# Patient Record
Sex: Male | Born: 1990
Health system: Southern US, Community
[De-identification: ages and names within clinical notes are randomized; demographics above are authoritative.]

## PROBLEM LIST (undated history)

## (undated) DIAGNOSIS — U071 COVID-19: Secondary | ICD-10-CM

## (undated) HISTORY — PX: NO PAST SURGERIES: SHX2092

---

## 2014-10-21 ENCOUNTER — Other Ambulatory Visit: Payer: Self-pay | Admitting: Infectious Disease

## 2014-10-21 ENCOUNTER — Ambulatory Visit
Admission: RE | Admit: 2014-10-21 | Discharge: 2014-10-21 | Disposition: A | Discharge status: Home / Self Care | Payer: No Typology Code available for payment source | Source: Ambulatory Visit | Attending: Infectious Disease | Admitting: Infectious Disease

## 2014-10-21 DIAGNOSIS — Z111 Encounter for screening for respiratory tuberculosis: Secondary | ICD-10-CM

## 2016-08-31 ENCOUNTER — Emergency Department (HOSPITAL_COMMUNITY)
Admission: EM | Admit: 2016-08-31 | Discharge: 2016-09-01 | Disposition: A | Discharge status: Home / Self Care | Payer: No Typology Code available for payment source | Attending: Emergency Medicine | Admitting: Emergency Medicine

## 2016-08-31 ENCOUNTER — Encounter (HOSPITAL_COMMUNITY): Payer: Self-pay | Admitting: Emergency Medicine

## 2016-08-31 DIAGNOSIS — J310 Chronic rhinitis: Secondary | ICD-10-CM

## 2016-08-31 DIAGNOSIS — J00 Acute nasopharyngitis [common cold]: Secondary | ICD-10-CM | POA: Diagnosis not present

## 2016-08-31 DIAGNOSIS — R509 Fever, unspecified: Secondary | ICD-10-CM | POA: Diagnosis present

## 2016-08-31 DIAGNOSIS — J029 Acute pharyngitis, unspecified: Secondary | ICD-10-CM

## 2016-08-31 LAB — RAPID STREP SCREEN (MED CTR MEBANE ONLY): Streptococcus, Group A Screen (Direct): NEGATIVE

## 2016-08-31 MED ORDER — ACETAMINOPHEN 325 MG PO TABS
650.0000 mg | ORAL_TABLET | Freq: Once | ORAL | Status: AC | PRN
  Administered 2016-08-31: 650 mg via ORAL

## 2016-08-31 MED ORDER — ACETAMINOPHEN 325 MG PO TABS
ORAL_TABLET | ORAL | Status: AC
  Filled 2016-08-31: qty 2

## 2016-08-31 NOTE — ED Triage Notes (Signed)
Pt c/o sore throat and fever.  denies CP or SOB. States it hurts to swallow sometimes. Pt took tylenol yesterday. Pt took advil today. Pt in NAD. Pt also c/o headache.

## 2016-09-01 MED ORDER — ACETAMINOPHEN 500 MG PO TABS: 1000.0000 mg | ORAL_TABLET | Freq: Four times a day (QID) | ORAL | 0 refills | Status: DC | PRN

## 2016-09-01 MED ORDER — FLUTICASONE PROPIONATE 50 MCG/ACT NA SUSP: 2.0000 | Freq: Every day | NASAL | 0 refills | Status: DC

## 2016-09-01 MED ORDER — DEXAMETHASONE 4 MG PO TABS
10.0000 mg | ORAL_TABLET | Freq: Once | ORAL | Status: AC
  Administered 2016-09-01: 10 mg via ORAL
  Filled 2016-09-01: qty 3

## 2016-09-01 MED ORDER — KETOROLAC TROMETHAMINE 60 MG/2ML IM SOLN
60.0000 mg | Freq: Once | INTRAMUSCULAR | Status: AC
  Administered 2016-09-01: 60 mg via INTRAMUSCULAR
  Filled 2016-09-01: qty 2

## 2016-09-01 MED ORDER — IBUPROFEN 800 MG PO TABS: 800.0000 mg | ORAL_TABLET | Freq: Three times a day (TID) | ORAL | 0 refills | Status: DC

## 2016-09-01 MED ORDER — PENICILLIN G BENZATHINE 1200000 UNIT/2ML IM SUSP
1.2000 10*6.[IU] | Freq: Once | INTRAMUSCULAR | Status: AC
  Administered 2016-09-01: 1.2 10*6.[IU] via INTRAMUSCULAR
  Filled 2016-09-01: qty 2

## 2016-09-01 MED ORDER — CETIRIZINE HCL 10 MG PO TABS: 10.0000 mg | ORAL_TABLET | Freq: Every day | ORAL | 1 refills | Status: DC

## 2016-09-01 NOTE — ED Provider Notes (Signed)
[facility]PT Provider Note   CSN: [id number] Arrival date & time: 08/31/16  2138     History   Chief Complaint Chief Complaint  Patient presents with  . Fever  . Headache    HPI Jeffrey Jensen is a 25 y.o. male.  HPI  Patient is a 25 year old male who presents to the emergency department complaining of 2 days of fever, sore throat and associated intermittent headache located in the back of his head.  He does have dysphasia and mild neck pain he denies neck stiffness.  He has had a month of nasal congestion what she believes is seasonal, associated with some clear nasal drainage, intermittent itchiness and sneezing.  Today he had mild "scratchiness" in his throat associated with mild cough, but he denies chest congestion, productive sputum, shortness of breath, wheeze, chest pain, tightness, night sweats, fatigue.  He also reports generalized body aches.  He denies sick contacts, rash, abdominal pain, N, V, diarrhea.  His HA is occipital, intermittent, associated with fever.  He denies blurry vision, eye pain, near syncope  History reviewed. No pertinent past medical history.  There are no active problems to display for this patient.   History reviewed. No pertinent surgical history.     Home Medications    Prior to Admission medications   Medication Sig Start Date End Date Taking? Authorizing Provider  sodium-potassium bicarbonate (ALKA-SELTZER GOLD) TBEF dissolvable tablet Take 1 tablet by mouth daily as needed.   Yes Historical Provider, MD  acetaminophen (TYLENOL) 500 MG tablet Take 2 tablets (1,000 mg total) by mouth every 6 (six) hours as needed for moderate pain or fever. Do not exceed 4000 mg total in 24 hours 09/01/16   Danelle BerryLeisa Lisle Skillman, PA-C  cetirizine (ZYRTEC ALLERGY) 10 MG tablet Take 1 tablet (10 mg total) by mouth daily. 09/01/16   Danelle BerryLeisa Caroline Matters, PA-C  fluticasone (FLONASE) 50 MCG/ACT nasal spray Place 2 sprays into both nostrils daily. 09/01/16   Danelle BerryLeisa Maeli Spacek, PA-C    ibuprofen (ADVIL,MOTRIN) 800 MG tablet Take 1 tablet (800 mg total) by mouth 3 (three) times daily. Patient taking differently: Take 800 mg by mouth 2 (two) times daily.  09/01/16   Danelle BerryLeisa Haden Cavenaugh, PA-C    Family History No family history on file.  Social History Social History  Substance Use Topics  . Smoking status: Never Smoker  . Smokeless tobacco: Never Used  . Alcohol use No     Allergies   Patient has no known allergies.   Review of Systems Review of Systems 10 Systems reviewed and are negative for acute change except as noted in the HPI.   Physical Exam Updated Vital Signs BP 105/61   Pulse 80   Temp 99.4 F (37.4 C) (Oral)   Resp 18   Ht 5\' 3"  (1.6 m)   Wt 62.6 kg   SpO2 97%   BMI 24.45 kg/m   Physical Exam  Constitutional: He is oriented to person, place, and time. He appears well-developed and well-nourished. No distress.  HENT:  Head: Normocephalic and atraumatic.  Right Ear: Hearing, tympanic membrane, external ear and ear canal normal.  Left Ear: Hearing, tympanic membrane, external ear and ear canal normal.  Nose: Mucosal edema and rhinorrhea present. Right sinus exhibits no maxillary sinus tenderness and no frontal sinus tenderness. Left sinus exhibits no maxillary sinus tenderness and no frontal sinus tenderness.  Mouth/Throat: Uvula is midline. Mucous membranes are not pale, dry and not cyanotic. No trismus in the jaw. No uvula swelling. Oropharyngeal  exudate, posterior oropharyngeal edema and posterior oropharyngeal erythema present. No tonsillar abscesses. Tonsils are 3+ on the right. Tonsils are 3+ on the left.  Eyes: Conjunctivae, EOM and lids are normal. Pupils are equal, round, and reactive to light. Right eye exhibits no discharge. Left eye exhibits no discharge. No scleral icterus.  Neck: Normal range of motion, full passive range of motion without pain and phonation normal. Neck supple. No spinous process tenderness and no muscular tenderness  present. No neck rigidity. No tracheal deviation and normal range of motion present. No Brudzinski's sign and no Kernig's sign noted.  Cardiovascular: Normal rate, regular rhythm, normal heart sounds and intact distal pulses.  Exam reveals no gallop and no friction rub.   No murmur heard. Pulmonary/Chest: Effort normal and breath sounds normal. No stridor. No respiratory distress. He has no wheezes. He has no rales. He exhibits no tenderness.  Abdominal: Soft. Bowel sounds are normal. He exhibits no distension and no mass. There is no tenderness. There is no rebound and no guarding.  Musculoskeletal: Normal range of motion. He exhibits no deformity.  Lymphadenopathy:    He has cervical adenopathy.  Neurological: He is alert and oriented to person, place, and time. He has normal strength and normal reflexes. He is not disoriented. He displays no tremor. No cranial nerve deficit or sensory deficit. He exhibits normal muscle tone. He displays a negative Romberg sign. Coordination and gait normal.  Skin: Skin is warm and dry. Capillary refill takes less than 2 seconds. No rash noted. He is not diaphoretic. No erythema. No pallor.  Psychiatric: He has a normal mood and affect. His behavior is normal. Judgment and thought content normal.  Nursing note and vitals reviewed.    ED Treatments / Results  Labs (all labs ordered are listed, but only abnormal results are displayed) Labs Reviewed  RAPID STREP SCREEN (NOT AT [facility])  CULTURE, GROUP A STREP [facility])    EKG  EKG Interpretation None       Radiology No results found.  Procedures Procedures (including critical care time)  Medications Ordered in ED Medications  acetaminophen (TYLENOL) tablet 650 mg (650 mg Oral Given 08/31/16 2150)  ketorolac (TORADOL) injection 60 mg (60 mg Intramuscular Given 09/01/16 0127)  dexamethasone (DECADRON) tablet 10 mg (10 mg Oral Given 09/01/16 0127)  penicillin g benzathine (BICILLIN LA) 1200000 UNIT/2ML  injection 1.2 Million Units (1.2 Million Units Intramuscular Given 09/01/16 0127)     Initial Impression / Assessment and Plan / ED Course  I have reviewed the triage vital signs and the nursing notes.  Pertinent labs & imaging results that were available during my care of the patient were reviewed by me and considered in my medical decision making (see chart for details).  Clinical Course    Pt with fever, HA and sore throat.  Negative meningeal signs, exam concerning for strep pharyngitis.  Uvula is midline and tonsils are 2-3+ and symmetrical with erythema and exudate, no concern for PTA.  He is able to tolerate liquids.  Appears mildly dehydrated but otherwise well-appearing and nontoxic appearing.  Treated in the Ed with steroids, NSAIDs, Pain medication and PCN IM.  Pt appears mildly dehydrated, discussed importance of water rehydration. Presentation non concerning for PTA or infxn spread to soft tissue. No trismus or uvula deviation. Specific return precautions discussed. Pt able to drink water in ED without difficulty with intact air way. HA improved with medications.  Recommended PCP follow up.   Discharged home in good condition.  Final Clinical Impressions(s) / ED Diagnoses   Final diagnoses:  Pharyngitis, unspecified etiology  Rhinitis, unspecified chronicity, unspecified type    New Prescriptions Discharge Medication List as of 09/01/2016 12:42 AM    START taking these medications   Details  cetirizine (ZYRTEC ALLERGY) 10 MG tablet Take 1 tablet (10 mg total) by mouth daily., Starting Wed 09/01/2016, Print    fluticasone (FLONASE) 50 MCG/ACT nasal spray Place 2 sprays into both nostrils daily., Starting Wed 09/01/2016, Print         Danelle BerryLeisa Nomar Broad, PA-C 09/04/16 [id number]    Glynn OctaveStephen Rancour, MD 09/04/16 1059

## 2016-09-03 ENCOUNTER — Emergency Department (HOSPITAL_COMMUNITY)
Admission: EM | Admit: 2016-09-03 | Discharge: 2016-09-03 | Disposition: A | Discharge status: Home / Self Care | Payer: No Typology Code available for payment source | Attending: Emergency Medicine | Admitting: Emergency Medicine

## 2016-09-03 ENCOUNTER — Encounter (HOSPITAL_COMMUNITY): Payer: Self-pay | Admitting: Nurse Practitioner

## 2016-09-03 DIAGNOSIS — R1013 Epigastric pain: Secondary | ICD-10-CM | POA: Insufficient documentation

## 2016-09-03 LAB — CBC
HCT: 37.4 % — ABNORMAL LOW (ref 39.0–52.0)
HEMOGLOBIN: 12.4 g/dL — AB (ref 13.0–17.0)
MCH: 26.3 pg (ref 26.0–34.0)
MCHC: 33.2 g/dL (ref 30.0–36.0)
MCV: 79.4 fL (ref 78.0–100.0)
PLATELETS: 293 10*3/uL (ref 150–400)
RBC: 4.71 MIL/uL (ref 4.22–5.81)
RDW: 13.6 % (ref 11.5–15.5)
WBC: 7.9 10*3/uL (ref 4.0–10.5)

## 2016-09-03 LAB — COMPREHENSIVE METABOLIC PANEL
ALK PHOS: 54 U/L (ref 38–126)
ALT: 233 U/L — AB (ref 17–63)
ANION GAP: 8 (ref 5–15)
AST: 76 U/L — ABNORMAL HIGH (ref 15–41)
Albumin: 3.6 g/dL (ref 3.5–5.0)
BUN: 12 mg/dL (ref 6–20)
CALCIUM: 9.4 mg/dL (ref 8.9–10.3)
CHLORIDE: 108 mmol/L (ref 101–111)
CO2: 26 mmol/L (ref 22–32)
CREATININE: 0.73 mg/dL (ref 0.61–1.24)
Glucose, Bld: 84 mg/dL (ref 65–99)
Potassium: 3.7 mmol/L (ref 3.5–5.1)
SODIUM: 142 mmol/L (ref 135–145)
Total Bilirubin: 0.4 mg/dL (ref 0.3–1.2)
Total Protein: 6.9 g/dL (ref 6.5–8.1)

## 2016-09-03 LAB — CULTURE, GROUP A STREP (THRC)

## 2016-09-03 LAB — URINALYSIS, ROUTINE W REFLEX MICROSCOPIC
Bilirubin Urine: NEGATIVE
Glucose, UA: NEGATIVE mg/dL
HGB URINE DIPSTICK: NEGATIVE
Ketones, ur: NEGATIVE mg/dL
LEUKOCYTES UA: NEGATIVE
Nitrite: NEGATIVE
PROTEIN: NEGATIVE mg/dL
SPECIFIC GRAVITY, URINE: 1.014 (ref 1.005–1.030)
pH: 5.5 (ref 5.0–8.0)

## 2016-09-03 LAB — LIPASE, BLOOD: LIPASE: 25 U/L (ref 11–51)

## 2016-09-03 MED ORDER — GI COCKTAIL ~~LOC~~
30.0000 mL | Freq: Once | ORAL | Status: AC
  Administered 2016-09-03: 30 mL via ORAL
  Filled 2016-09-03: qty 30

## 2016-09-03 NOTE — Discharge Instructions (Signed)
This is most likely from the virus, or could be from the medications you're taking for the virus.  Try zantac 150mg  twice a day.

## 2016-09-03 NOTE — ED Provider Notes (Signed)
[facility]PT Provider Note   CSN: [id number] Arrival date & time: 09/03/16  1035     History   Chief Complaint Chief Complaint  Patient presents with  . Abdominal Pain    HPI Jeffrey Jensen is a 25 y.o. male.  25 yo M with a chief complaint of epigastric abdominal pain. This been going on for the past 3 days. Patient was recently seen in the ED and diagnosed with a viral illness was given penicillin for possible strep though the rapid strep was negative. He continued to have some cough and congestion and some subjective fevers and chills. Denies nausea vomiting or diarrhea. Denies injury.   The history is provided by the patient.  Abdominal Pain   This is a new problem. The current episode started 2 days ago. The problem occurs constantly. The problem has not changed since onset.The pain is located in the epigastric region. The quality of the pain is sharp and burning. The pain is at a severity of 5/10. The pain is mild. Pertinent negatives include fever, diarrhea, vomiting, headaches, arthralgias and myalgias. Nothing aggravates the symptoms. Nothing relieves the symptoms. Past workup does not include GI consult, CT scan or surgery.    History reviewed. No pertinent past medical history.  There are no active problems to display for this patient.   History reviewed. No pertinent surgical history.     Home Medications    Prior to Admission medications   Medication Sig Start Date End Date Taking? Authorizing Provider  acetaminophen (TYLENOL) 500 MG tablet Take 2 tablets (1,000 mg total) by mouth every 6 (six) hours as needed for moderate pain or fever. Do not exceed 4000 mg total in 24 hours 09/01/16  Yes Danelle BerryLeisa Tapia, PA-C  cetirizine (ZYRTEC ALLERGY) 10 MG tablet Take 1 tablet (10 mg total) by mouth daily. 09/01/16  Yes Danelle BerryLeisa Tapia, PA-C  fluticasone (FLONASE) 50 MCG/ACT nasal spray Place 2 sprays into both nostrils daily. 09/01/16  Yes Danelle BerryLeisa Tapia, PA-C  ibuprofen  (ADVIL,MOTRIN) 800 MG tablet Take 1 tablet (800 mg total) by mouth 3 (three) times daily. Patient taking differently: Take 800 mg by mouth 2 (two) times daily.  09/01/16  Yes Danelle BerryLeisa Tapia, PA-C  sodium-potassium bicarbonate (ALKA-SELTZER GOLD) TBEF dissolvable tablet Take 1 tablet by mouth daily as needed.   Yes Historical Provider, MD    Family History History reviewed. No pertinent family history.  Social History Social History  Substance Use Topics  . Smoking status: Never Smoker  . Smokeless tobacco: Never Used  . Alcohol use No     Allergies   Patient has no known allergies.   Review of Systems Review of Systems  Constitutional: Negative for chills and fever.  HENT: Negative for congestion and facial swelling.   Eyes: Negative for discharge and visual disturbance.  Respiratory: Negative for shortness of breath.   Cardiovascular: Negative for chest pain and palpitations.  Gastrointestinal: Positive for abdominal pain. Negative for diarrhea and vomiting.  Musculoskeletal: Negative for arthralgias and myalgias.  Skin: Negative for color change and rash.  Neurological: Negative for tremors, syncope and headaches.  Psychiatric/Behavioral: Negative for confusion and dysphoric mood.     Physical Exam Updated Vital Signs BP 118/58 (BP Location: Right Arm)   Pulse 64   Temp 98.1 F (36.7 C) (Oral)   Resp 16   Ht 5\' 3"  (1.6 m)   Wt 138 lb (62.6 kg)   SpO2 97%   BMI 24.45 kg/m   Physical Exam  Constitutional:  He is oriented to person, place, and time. He appears well-developed and well-nourished.  HENT:  Head: Normocephalic and atraumatic.  Eyes: EOM are normal. Pupils are equal, round, and reactive to light.  Neck: Normal range of motion. Neck supple. No JVD present.  Cardiovascular: Normal rate and regular rhythm.  Exam reveals no gallop and no friction rub.   No murmur heard. Pulmonary/Chest: No respiratory distress. He has no wheezes.  Abdominal: He exhibits no  distension and no mass. There is tenderness (very mild epigastric). There is no rebound and no guarding.  Musculoskeletal: Normal range of motion.  Neurological: He is alert and oriented to person, place, and time.  Skin: No rash noted. No pallor.  Psychiatric: He has a normal mood and affect. His behavior is normal.  Nursing note and vitals reviewed.    ED Treatments / Results  Labs (all labs ordered are listed, but only abnormal results are displayed) Labs Reviewed  COMPREHENSIVE METABOLIC PANEL - Abnormal; Notable for the following:       Result Value   AST 76 (*)    ALT 233 (*)    All other components within normal limits  CBC - Abnormal; Notable for the following:    Hemoglobin 12.4 (*)    HCT 37.4 (*)    All other components within normal limits  URINALYSIS, ROUTINE W REFLEX MICROSCOPIC (NOT AT [facility])  LIPASE, BLOOD    EKG  EKG Interpretation  Date/Time:  Friday September 03 2016 11:01:01 EST Ventricular Rate:  62 PR Interval:  126 QRS Duration: 90 QT Interval:  380 QTC Calculation: 385 R Axis:   64 Text Interpretation:  Normal sinus rhythm Early repolarization Normal ECG No old tracing to compare Confirmed by Cathrine Krizan MD, DANIEL ([id number]) on 09/03/2016 11:26:47 AM       Radiology No results found.  Procedures Procedures (including critical care time)  Medications Ordered in ED Medications  gi cocktail (Maalox,Lidocaine,Donnatal) (30 mLs Oral Given 09/03/16 1147)     Initial Impression / Assessment and Plan / ED Course  I have reviewed the triage vital signs and the nursing notes.  Pertinent labs & imaging results that were available during my care of the patient were reviewed by me and considered in my medical decision making (see chart for details).  Clinical Course     25 yo M With a chief complaint of epigastric pain. Patient's abdominal exam is benign. Will obtain labs to rule out hepatitis or pancreatitis. Give him a GI cocktail.  Labs are  reassuring. Discharge home.  1:21 PM:  I have discussed the diagnosis/risks/treatment options with the patient and believe the pt to be eligible for discharge home to follow-up with PCP. We also discussed returning to the ED immediately if new or worsening sx occur. We discussed the sx which are most concerning (e.g., sudden worsening pain, fever, inability to tolerate by mouth) that necessitate immediate return. Medications administered to the patient during their visit and any new prescriptions provided to the patient are listed below.  Medications given during this visit Medications  gi cocktail (Maalox,Lidocaine,Donnatal) (30 mLs Oral Given 09/03/16 1147)     The patient appears reasonably screen and/or stabilized for discharge and I doubt any other medical condition or other [facility] requiring further screening, evaluation, or treatment in the ED at this time prior to discharge.    Final Clinical Impressions(s) / ED Diagnoses   Final diagnoses:  Epigastric abdominal pain    New Prescriptions New Prescriptions   No  medications on file     Melene Plan, DO 09/03/16 1321

## 2016-09-03 NOTE — ED Notes (Signed)
Denies n/v/d 

## 2016-09-03 NOTE — ED Triage Notes (Addendum)
Pt presents with c/o abd pain. The pain began 2 days ago after he was discharged from the ED. He was here for a viral infection and discharged home with ibuprofen, tylenol, flonase, cetirizine. He states he took each of the medications one time. His viral symptoms have improved but he began to notice the abdominal pain that evening which has been persistent since. He denies fevers, cough, sob, cp, n/v, urinary changes. He reports increased bowel movements but does not describe as diarrhea. He drank water last night which helped the pain. Pain is increased with food intake. He is alert and breathing easily.

## 2016-09-03 NOTE — ED Notes (Signed)
ED Provider at bedside. 

## 2019-06-11 ENCOUNTER — Other Ambulatory Visit: Payer: Self-pay

## 2019-06-11 DIAGNOSIS — Z20822 Contact with and (suspected) exposure to covid-19: Secondary | ICD-10-CM

## 2019-06-13 ENCOUNTER — Telehealth: Payer: Self-pay

## 2019-06-13 LAB — NOVEL CORONAVIRUS, NAA: SARS-CoV-2, NAA: DETECTED — AB

## 2019-06-13 NOTE — Telephone Encounter (Signed)
Patient returning call for covid result. NT unavailable. Please contact patient with result.

## 2019-06-13 NOTE — Telephone Encounter (Signed)
Call to patient- patient notified of + COVID result. Patient was tested for symptoms fever, headache, fatigue. Advised patient to hydrate and treat symptoms with OTC medication, seek help for symptoms that are not improving and for emergent symptoms(trouble breathing/dehydration) go to ED. Reviewed CDC recommendations for isolation, ending isolation and safe home practices to prevent spread to others. Patient informed that health dept may be calling him. Questions asked and answered.

## 2019-06-17 ENCOUNTER — Other Ambulatory Visit: Payer: Self-pay

## 2019-06-17 ENCOUNTER — Encounter (HOSPITAL_COMMUNITY): Payer: Self-pay

## 2019-06-17 ENCOUNTER — Emergency Department (HOSPITAL_COMMUNITY): Payer: HRSA Program

## 2019-06-17 ENCOUNTER — Emergency Department (HOSPITAL_COMMUNITY)
Admission: EM | Admit: 2019-06-17 | Discharge: 2019-06-17 | Disposition: A | Discharge status: Home / Self Care | Payer: HRSA Program | Attending: Emergency Medicine | Admitting: Emergency Medicine

## 2019-06-17 DIAGNOSIS — U071 COVID-19: Secondary | ICD-10-CM | POA: Diagnosis present

## 2019-06-17 DIAGNOSIS — Z79899 Other long term (current) drug therapy: Secondary | ICD-10-CM | POA: Insufficient documentation

## 2019-06-17 DIAGNOSIS — R5383 Other fatigue: Secondary | ICD-10-CM | POA: Diagnosis not present

## 2019-06-17 NOTE — ED Triage Notes (Signed)
Pt presents with ongoing dizziness and all weakness, starting 06/06/2019, recently diagnosed with covid, presents to ED today because symptoms are not better.

## 2019-06-17 NOTE — Discharge Instructions (Addendum)
You need to stay well hydrated. Rotate tylenol and motrin for body aches and fevers.   You should be isolated for at least 7 days since the onset of your symptoms AND >72 hours after symptoms resolution (absence of fever without the use of fever reducing medication and improvement in respiratory symptoms), whichever is longer  Please follow up with your primary care provider within 5-7 days for re-evaluation of your symptoms. If you do not have a primary care provider, information for a healthcare clinic has been provided for you to make arrangements for follow up care. Please return to the emergency department for any new or worsening symptoms.

## 2019-06-17 NOTE — ED Provider Notes (Signed)
[facility] EMERGENC[facility] Provider Note   CSN: [id number] Arrival date & time: 06/17/19  1203     History   Chief Complaint Chief Complaint  Patient presents with  . Fatigue    covid+    HPI Jacqulyn Caneham Kaney is a 28 y.o. male.     HPI   Patient is a 28 year old male who presents to the emergency department today for evaluation of generalized fatigue and fevers.  States that he has been feeling bad for 2 weeks.  He initially was having sweats, chills, fatigue, headaches.  He was tested for the coronavirus and was told it was positive.  Since then the sweats and chills have improved.  His temperatures at home have been below 100 F.  He is concerned because he has continued to feel very fatigued and tired.  He thought his symptoms would have improved by now.  He denies any chest pain, cough, shortness of breath, abdominal pain, nausea, vomiting, diarrhea, urinary symptoms. States he feels somewhat dizzy/lightheaded intermittently. No vertiginous sxs. Sxs are positional. No continued HA's or other neurologic complaints.   Reviewed records, patient was tested for COVID on 06/11/2019 and result was positive.  History reviewed. No pertinent past medical history.  There are no active problems to display for this patient.   History reviewed. No pertinent surgical history.      Home Medications    Prior to Admission medications   Medication Sig Start Date End Date Taking? Authorizing Provider  acetaminophen (TYLENOL) 500 MG tablet Take 2 tablets (1,000 mg total) by mouth every 6 (six) hours as needed for moderate pain or fever. Do not exceed 4000 mg total in 24 hours 09/01/16   Danelle Berryapia, Leisa, PA-C  cetirizine (ZYRTEC ALLERGY) 10 MG tablet Take 1 tablet (10 mg total) by mouth daily. 09/01/16   Danelle Berryapia, Leisa, PA-C  fluticasone (FLONASE) 50 MCG/ACT nasal spray Place 2 sprays into both nostrils daily. 09/01/16   Danelle Berryapia, Leisa, PA-C  ibuprofen (ADVIL,MOTRIN) 800 MG tablet Take  1 tablet (800 mg total) by mouth 3 (three) times daily. Patient taking differently: Take 800 mg by mouth 2 (two) times daily.  09/01/16   Danelle Berryapia, Leisa, PA-C  sodium-potassium bicarbonate (ALKA-SELTZER GOLD) TBEF dissolvable tablet Take 1 tablet by mouth daily as needed.    [provider]    Family History History reviewed. No pertinent family history.  Social History Social History   Tobacco Use  . Smoking status: Never Smoker  . Smokeless tobacco: Never Used  Substance Use Topics  . Alcohol use: No  . Drug use: No     Allergies   Patient has no known allergies.   Review of Systems Review of Systems  Constitutional: Positive for chills, diaphoresis and fatigue.  HENT: Negative for ear pain and sore throat.   Eyes: Negative for visual disturbance.  Respiratory: Negative for cough and shortness of breath.   Cardiovascular: Negative for chest pain.  Gastrointestinal: Negative for abdominal pain, constipation, diarrhea, nausea and vomiting.  Genitourinary: Negative for dysuria.  Musculoskeletal: Negative for back pain.  Skin: Negative for rash.  Neurological: Positive for headaches (improved).  All other systems reviewed and are negative.    Physical Exam Updated Vital Signs BP (!) 133/91 (BP Location: Right Arm)   Pulse (!) 105   Temp 99.3 F (37.4 C) (Oral)   Resp 20   SpO2 97%   Physical Exam Vitals signs and nursing note reviewed.  Constitutional:      Appearance:  He is well-developed.  HENT:     Head: Normocephalic and atraumatic.     Mouth/Throat:     Mouth: Mucous membranes are moist.  Eyes:     Conjunctiva/sclera: Conjunctivae normal.  Neck:     Musculoskeletal: Neck supple.  Cardiovascular:     Rate and Rhythm: Normal rate and regular rhythm.     Heart sounds: Normal heart sounds. No murmur.  Pulmonary:     Effort: Pulmonary effort is normal. No respiratory distress.     Breath sounds: Normal breath sounds. No wheezing, rhonchi or rales.   Abdominal:     General: Bowel sounds are normal. There is no distension.     Palpations: Abdomen is soft.     Tenderness: There is no abdominal tenderness. There is no guarding or rebound.  Skin:    General: Skin is warm and dry.  Neurological:     Mental Status: He is alert.    ED Treatments / Results  Labs (all labs ordered are listed, but only abnormal results are displayed) Labs Reviewed - No data to display  EKG None  Radiology Dg Chest Portable 1 View  Result Date: 06/17/2019 CLINICAL DATA:  COVID positive EXAM: PORTABLE CHEST 1 VIEW COMPARISON:  None. FINDINGS: Lungs are clear.  No pleural effusion or pneumothorax. The heart is normal in size. IMPRESSION: No evidence of acute cardiopulmonary disease in this patient with known COVID. Electronically Signed   By: Julian Hy M.D.   On: 06/17/2019 12:52    Procedures Procedures (including critical care time)  Medications Ordered in ED Medications - No data to display   Initial Impression / Assessment and Plan / ED Course  I have reviewed the triage vital signs and the nursing notes.  Pertinent labs & imaging results that were available during my care of the patient were reviewed by me and considered in my medical decision making (see chart for details).     Final Clinical Impressions(s) / ED Diagnoses   Final diagnoses:  Fatigue, unspecified type  COVID-18   28 year old male recently diagnosed with COVID about 5 days ago, presenting to the emergency department today complaining of generalized fatigue and weakness since being diagnosed with covid.   No other systemic sxs. Marginally tachycardic but otherwise reassuring. Orthostatics revealed normal blood pressures but patient heart rate went from 70-90 with standing.  I think that this is likely the cause of his dizziness/lightheadedness.  He was given 750 cc oral fluids in the ED.  He was also encouraged to continue to orally hydrate at home.  Advised  Tylenol/Motrin for fevers.  Advised to self quarantine.  Advised to follow-up with PCP.  Advised on return precautions.  He voices understanding and is in agreement with plan.  All questions answered.  Patient stable for discharge.   ---------------  [name] was evaluated in Emergency Department on 06/17/2019 for the symptoms described in the history of present illness. He was evaluated in the context of the global COVID-19 pandemic, which necessitated consideration that the patient might be at risk for infection with the SARS-CoV-2 virus that causes COVID-19. Institutional protocols and algorithms that pertain to the evaluation of patients at risk for COVID-19 are in a state of rapid change based on information released by regulatory bodies including the CDC and federal and state organizations. These policies and algorithms were followed during the patient's care in the ED.   ED Discharge Orders    None       Zariel Capano  S, PA-C 06/17/19 1316    Cathren LaineSteinl, Kevin, MD 06/17/19 [phone]

## 2019-06-17 NOTE — ED Notes (Signed)
Pt given 3 cups of water  

## 2019-06-17 NOTE — ED Notes (Signed)
Pt dc'd home with all belongings, ambulatory on dc, pt verbalizes understanding of dc instructions, no narcotics given in ED

## 2019-06-17 NOTE — ED Notes (Signed)
PA at bedside.

## 2019-07-01 ENCOUNTER — Emergency Department (HOSPITAL_COMMUNITY): Payer: HRSA Program

## 2019-07-01 ENCOUNTER — Encounter (HOSPITAL_COMMUNITY): Payer: Self-pay | Admitting: *Deleted

## 2019-07-01 ENCOUNTER — Emergency Department (HOSPITAL_COMMUNITY)
Admission: EM | Admit: 2019-07-01 | Discharge: 2019-07-01 | Disposition: A | Discharge status: Home / Self Care | Payer: HRSA Program | Attending: Emergency Medicine | Admitting: Emergency Medicine

## 2019-07-01 ENCOUNTER — Other Ambulatory Visit: Payer: Self-pay

## 2019-07-01 DIAGNOSIS — U071 COVID-19: Secondary | ICD-10-CM

## 2019-07-01 DIAGNOSIS — R51 Headache: Secondary | ICD-10-CM | POA: Diagnosis present

## 2019-07-01 LAB — COMPREHENSIVE METABOLIC PANEL
ALT: 198 U/L — ABNORMAL HIGH (ref 0–44)
AST: 68 U/L — ABNORMAL HIGH (ref 15–41)
Albumin: 4 g/dL (ref 3.5–5.0)
Alkaline Phosphatase: 63 U/L (ref 38–126)
Anion gap: 10 (ref 5–15)
BUN: 9 mg/dL (ref 6–20)
CO2: 26 mmol/L (ref 22–32)
Calcium: 9.5 mg/dL (ref 8.9–10.3)
Chloride: 102 mmol/L (ref 98–111)
Creatinine, Ser: 0.7 mg/dL (ref 0.61–1.24)
GFR calc Af Amer: >60 mL/min (ref >60)
GFR calc non Af Amer: >60 mL/min (ref >60)
Glucose, Bld: 126 mg/dL — ABNORMAL HIGH (ref 70–99)
Potassium: 3.9 mmol/L (ref 3.5–5.1)
Sodium: 138 mmol/L (ref 135–145)
Total Bilirubin: 0.6 mg/dL (ref 0.3–1.2)
Total Protein: 7.5 g/dL (ref 6.5–8.1)

## 2019-07-01 LAB — CBC WITH DIFFERENTIAL/PLATELET
Abs Immature Granulocytes: 0.03 10*3/uL (ref 0.00–0.07)
Basophils Absolute: 0 10*3/uL (ref 0.0–0.1)
Basophils Relative: 0 %
Eosinophils Absolute: 0.2 10*3/uL (ref 0.0–0.5)
Eosinophils Relative: 2 %
HCT: 40.4 % (ref 39.0–52.0)
Hemoglobin: 13.5 g/dL (ref 13.0–17.0)
Immature Granulocytes: 0 %
Lymphocytes Relative: 31 %
Lymphs Abs: 2.5 10*3/uL (ref 0.7–4.0)
MCH: 26.9 pg (ref 26.0–34.0)
MCHC: 33.4 g/dL (ref 30.0–36.0)
MCV: 80.6 fL (ref 80.0–100.0)
Monocytes Absolute: 0.5 10*3/uL (ref 0.1–1.0)
Monocytes Relative: 6 %
Neutro Abs: 4.7 10*3/uL (ref 1.7–7.7)
Neutrophils Relative %: 61 %
Platelets: 371 10*3/uL (ref 150–400)
RBC: 5.01 MIL/uL (ref 4.22–5.81)
RDW: 14 % (ref 11.5–15.5)
WBC: 7.9 10*3/uL (ref 4.0–10.5)
nRBC: 0 % (ref 0.0–0.2)

## 2019-07-01 LAB — CK: Total CK: 88 U/L (ref 49–397)

## 2019-07-01 LAB — SARS CORONAVIRUS 2 BY RT PCR (HOSPITAL ORDER, PERFORMED IN ~~LOC~~ HOSPITAL LAB): SARS Coronavirus 2: POSITIVE — AB

## 2019-07-01 MED ORDER — METOCLOPRAMIDE HCL 5 MG/ML IJ SOLN
10.0000 mg | Freq: Once | INTRAMUSCULAR | Status: AC
  Administered 2019-07-01: 10 mg via INTRAVENOUS
  Filled 2019-07-01: qty 2

## 2019-07-01 MED ORDER — ACETAMINOPHEN 500 MG PO TABS
1000.0000 mg | ORAL_TABLET | Freq: Once | ORAL | Status: AC
  Administered 2019-07-01: 1000 mg via ORAL
  Filled 2019-07-01: qty 2

## 2019-07-01 MED ORDER — SODIUM CHLORIDE 0.9 % IV BOLUS
1000.0000 mL | Freq: Once | INTRAVENOUS | Status: AC
  Administered 2019-07-01: 1000 mL via INTRAVENOUS

## 2019-07-01 MED ORDER — DIPHENHYDRAMINE HCL 50 MG/ML IJ SOLN
25.0000 mg | Freq: Once | INTRAMUSCULAR | Status: AC
  Administered 2019-07-01: 25 mg via INTRAVENOUS
  Filled 2019-07-01: qty 1

## 2019-07-01 NOTE — Discharge Instructions (Signed)
You still have COVID.   Stay hydrated   Take tylenol for headaches   See your doctor  Stay home for 2 weeks   Return to ER if you have worse shortness of breath, trouble breathing, muscle aches, fever, vomiting.      Person Under Monitoring Name: Jeffrey Jensen  Location: [address] [place] [id number]   Infection Prevention Recommendations for Individuals Confirmed to have, or Being Evaluated for, 2019 Novel Coronavirus (COVID-19) Infection Who Receive Care at Home  Individuals who are confirmed to have, or are being evaluated for, COVID-19 should follow the prevention steps below until a healthcare provider or local or state health department says they can return to normal activities.  Stay home except to get medical care You should restrict activities outside your home, except for getting medical care. Do not go to work, school, or public areas, and do not use public transportation or taxis.  Call ahead before visiting your doctor Before your medical appointment, call the healthcare provider and tell them that you have, or are being evaluated for, COVID-19 infection. This will help the healthcare providers office take steps to keep other people from getting infected. Ask your healthcare provider to call the local or state health department.  Monitor your symptoms Seek prompt medical attention if your illness is worsening (e.g., difficulty breathing). Before going to your medical appointment, call the healthcare provider and tell them that you have, or are being evaluated for, COVID-19 infection. Ask your healthcare provider to call the local or state health department.  Wear a facemask You should wear a facemask that covers your nose and mouth when you are in the same room with other people and when you visit a healthcare provider. People who live with or visit you should also wear a facemask while they are in the same room with you.  Separate yourself from other  people in your home As much as possible, you should stay in a different room from other people in your home. Also, you should use a separate bathroom, if available.  Avoid sharing household items You should not share dishes, drinking glasses, cups, eating utensils, towels, bedding, or other items with other people in your home. After using these items, you should wash them thoroughly with soap and water.  Cover your coughs and sneezes Cover your mouth and nose with a tissue when you cough or sneeze, or you can cough or sneeze into your sleeve. Throw used tissues in a lined trash can, and immediately wash your hands with soap and water for at least 20 seconds or use an alcohol-based hand rub.  Wash your [employer]hands Wash your hands often and thoroughly with soap and water for at least 20 seconds. You can use an alcohol-based hand sanitizer if soap and water are not available and if your hands are not visibly dirty. Avoid touching your eyes, nose, and mouth with unwashed hands.   Prevention Steps for Caregivers and Household Members of Individuals Confirmed to have, or Being Evaluated for, COVID-19 Infection Being Cared for in the Home  If you live with, or provide care at home for, a person confirmed to have, or being evaluated for, COVID-19 infection please follow these guidelines to prevent infection:  Follow healthcare providers instructions Make sure that you understand and can help the patient follow any healthcare provider instructions for all care.  Provide for the patients basic needs You should help the patient with basic needs in the home and provide support  for getting groceries, prescriptions, and other personal needs.  Monitor the patients symptoms If they are getting sicker, call his or her medical provider and tell them that the patient has, or is being evaluated for, COVID-19 infection. This will help the healthcare providers office take steps to keep other people from  getting infected. Ask the healthcare provider to call the local or state health department.  Limit the number of people who have contact with the patient If possible, have only one caregiver for the patient. Other household members should stay in another home or place of residence. If this is not possible, they should stay in another room, or be separated from the patient as much as possible. Use a separate bathroom, if available. Restrict visitors who do not have an essential need to be in the home.  Keep older adults, very young children, and other sick people away from the patient Keep older adults, very young children, and those who have compromised immune systems or chronic health conditions away from the patient. This includes people with chronic heart, lung, or kidney conditions, diabetes, and cancer.  Ensure good ventilation Make sure that shared spaces in the home have good air flow, such as from an air conditioner or an opened window, weather permitting.  Wash your hands often Wash your hands often and thoroughly with soap and water for at least 20 seconds. You can use an alcohol based hand sanitizer if soap and water are not available and if your hands are not visibly dirty. Avoid touching your eyes, nose, and mouth with unwashed hands. Use disposable paper towels to dry your hands. If not available, use dedicated cloth towels and replace them when they become wet.  Wear a facemask and gloves Wear a disposable facemask at all times in the room and gloves when you touch or have contact with the patients blood, body fluids, and/or secretions or excretions, such as sweat, saliva, sputum, nasal mucus, vomit, urine, or feces.  Ensure the mask fits over your nose and mouth tightly, and do not touch it during use. Throw out disposable facemasks and gloves after using them. Do not reuse. Wash your hands immediately after removing your facemask and gloves. If your personal clothing  becomes contaminated, carefully remove clothing and launder. Wash your hands after handling contaminated clothing. Place all used disposable facemasks, gloves, and other waste in a lined container before disposing them with other household waste. Remove gloves and wash your hands immediately after handling these items.  Do not share dishes, glasses, or other household items with the patient Avoid sharing household items. You should not share dishes, drinking glasses, cups, eating utensils, towels, bedding, or other items with a patient who is confirmed to have, or being evaluated for, COVID-19 infection. After the person uses these items, you should wash them thoroughly with soap and water.  Wash laundry thoroughly Immediately remove and wash clothes or bedding that have blood, body fluids, and/or secretions or excretions, such as sweat, saliva, sputum, nasal mucus, vomit, urine, or feces, on them. Wear gloves when handling laundry from the patient. Read and follow directions on labels of laundry or clothing items and detergent. In general, wash and dry with the warmest temperatures recommended on the label.  Clean all areas the individual has used often Clean all touchable surfaces, such as counters, tabletops, doorknobs, bathroom fixtures, toilets, phones, keyboards, tablets, and bedside tables, every day. Also, clean any surfaces that may have blood, body fluids, and/or secretions or excretions on  them. Wear gloves when cleaning surfaces the patient has come in contact with. Use a diluted bleach solution (e.g., dilute bleach with 1 part bleach and 10 parts water) or a household disinfectant with a label that says EPA-registered for coronaviruses. To make a bleach solution at home, add 1 tablespoon of bleach to 1 quart (4 cups) of water. For a larger supply, add  cup of bleach to 1 gallon (16 cups) of water. Read labels of cleaning products and follow recommendations provided on product labels.  Labels contain instructions for safe and effective use of the cleaning product including precautions you should take when applying the product, such as wearing gloves or eye protection and making sure you have good ventilation during use of the product. Remove gloves and wash hands immediately after cleaning.  Monitor yourself for signs and symptoms of illness Caregivers and household members are considered close contacts, should monitor their health, and will be asked to limit movement outside of the home to the extent possible. Follow the monitoring steps for close contacts listed on the symptom monitoring form.   ? If you have additional questions, contact your local health department or call the epidemiologist on call at [phone] (available 24/7). ? This guidance is subject to change. For the most up-to-date guidance from [facility], please refer to their website: [email or web address]

## 2019-07-01 NOTE — ED Provider Notes (Signed)
[facility] EMERGENCY [facility]PARTMENT Provider Note   CSN: [id number] Arrival date & time: 07/01/19  1542     History   Chief Complaint Chief Complaint  Patient presents with  . Headache    HPI Jeffrey Jensen is a 29 y.o. male was healthy here presenting with headaches, persistent chills.  Patient states that he was tested positive for coronavirus on August 17.  He has been self quarantine and had some shortness of breath and myalgias.  He states that he also has some diffuse headaches for the last several weeks.  He states that his symptoms are not getting worse but has not been getting better at all.  Denies any neck stiffness and shortness of breath is stable.          The history is provided by the patient.    History reviewed. No pertinent past medical history.  There are no active problems to display for this patient.   History reviewed. No pertinent surgical history.      Home Medications    Prior to Admission medications   Medication Sig Start Date End Date Taking? Authorizing Provider  acetaminophen (TYLENOL) 500 MG tablet Take 2 tablets (1,000 mg total) by mouth every 6 (six) hours as needed for moderate pain or fever. Do not exceed 4000 mg total in 24 hours 09/01/16  Yes Delsa Grana, PA-C  ibuprofen (ADVIL) 200 MG tablet Take 200 mg by mouth every 6 (six) hours as needed for headache or mild pain.   Yes [provider]  cetirizine (ZYRTEC ALLERGY) 10 MG tablet Take 1 tablet (10 mg total) by mouth daily. Patient not taking: Reported on 07/01/2019 09/01/16   Delsa Grana, PA-C  fluticasone [facility]) 50 MCG/ACT nasal spray Place 2 sprays into both nostrils daily. Patient not taking: Reported on 07/01/2019 09/01/16   Delsa Grana, PA-C  ibuprofen (ADVIL,MOTRIN) 800 MG tablet Take 1 tablet (800 mg total) by mouth 3 (three) times daily. Patient not taking: Reported on 07/01/2019 09/01/16   Delsa Grana, PA-C    Family History History reviewed. No pertinent  family history.  Social History Social History   Tobacco Use  . Smoking status: Never Smoker  . Smokeless tobacco: Never Used  Substance Use Topics  . Alcohol use: No  . Drug use: No     Allergies   Patient has no known allergies.   Review of Systems Review of Systems  Respiratory: Positive for shortness of breath.   Neurological: Positive for headaches.  All other systems reviewed and are negative.    Physical Exam Updated Vital Signs BP 128/82 (BP Location: Right Arm)   Pulse 72   Temp 98.9 F (37.2 C) (Oral)   Resp (!) 24   Ht 5\' 3"  (1.6 m)   Wt 63.5 kg   SpO2 99%   BMI 24.80 kg/m   Physical Exam Vitals signs and nursing note reviewed.  HENT:     Head: Normocephalic.  Eyes:     Extraocular Movements: Extraocular movements intact.  Neck:     Musculoskeletal: Normal range of motion and neck supple.     Comments: No meningeal signs  Cardiovascular:     Rate and Rhythm: Normal rate and regular rhythm.     Heart sounds: Normal heart sounds.  Pulmonary:     Effort: Pulmonary effort is normal.     Breath sounds: Normal breath sounds.  Abdominal:     General: Bowel sounds are normal.     Palpations: Abdomen  is soft.  Musculoskeletal: Normal range of motion.  Skin:    General: Skin is warm.  Neurological:     Mental Status: He is alert.     Cranial Nerves: No cranial nerve deficit or facial asymmetry.  Psychiatric:        Mood and Affect: Mood normal.        Behavior: Behavior normal.      ED Treatments / Results  Labs (all labs ordered are listed, but only abnormal results are displayed) Labs Reviewed  SARS CORONAVIRUS 2 (HOSPITAL ORDER, PERFORMED IN [facility]) - Abnormal; Notable for the following components:      Result Value   SARS Coronavirus 2 POSITIVE (*)    All other components within normal limits  COMPREHENSIVE METABOLIC PANEL - Abnormal; Notable for the following components:   Glucose, Bld 126 (*)    AST 68 (*)     ALT 198 (*)    All other components within normal limits  CBC WITH DIFFERENTIAL/PLATELET  CK    EKG None  Radiology Dg Chest Port 1 View  Result Date: 07/01/2019 CLINICAL DATA:  PT reports he has a HA and he tested positive for COVID on 06-11-2019. Pt states HA continues and is not better. Pt has had body chill since COVID positive exam. Pt feels nausea at bedside today. No chest pain. EXAM: PORTABLE CHEST 1 VIEW COMPARISON:  Chest radiograph 06/17/2019 FINDINGS: Stable cardiomediastinal contours. Low volume study with bronchovascular crowding. No focal infiltrate. No pneumothorax or large pleural effusion. No acute abnormality in the visualized osseous structures. IMPRESSION: No evidence of active disease. Electronically Signed   By: Emmaline KluverNancy  Ballantyne M.D.   On: 07/01/2019 17:17    Procedures Procedures (including critical care time)  Medications Ordered in ED Medications  sodium chloride 0.9 % bolus 1,000 mL (1,000 mLs Intravenous New Bag/Given 07/01/19 1651)  metoCLOPramide (REGLAN) injection 10 mg (10 mg Intravenous Given 07/01/19 1650)  diphenhydrAMINE (BENADRYL) injection 25 mg (25 mg Intravenous Given 07/01/19 1651)  acetaminophen (TYLENOL) tablet 1,000 mg (1,000 mg Oral Given 07/01/19 1650)     Initial Impression / Assessment and Plan / ED Course  I have reviewed the triage vital signs and the nursing notes.  Pertinent labs & imaging results that were available during my care of the patient were reviewed by me and considered in my medical decision making (see chart for details).       Jeffrey Jensen is a 28 y.o. male here with headaches, recently positive for COVID. He is afebrile, not hypoxic. He has no meningeal signs. He had COVID about 3 weeks ago. Will retest for COVID as he may have not cleared it. Will get labs, CXR. Will give migraine cocktail   7:39 PM Labs unremarkable. Still COVID positive. CXR clear. I think he did not clear his COVID and has persistent symptoms from that.   He is not hypoxic and is well-appearing and does not meet criteria for admission. Told him to quarantine for another 2 weeks and gave strict return precautions   Jeffrey Jensen was evaluated in Emergency Department on 07/01/2019 for the symptoms described in the history of present illness. He was evaluated in the context of the global COVID-19 pandemic, which necessitated consideration that the patient might be at risk for infection with the SARS-CoV-2 virus that causes COVID-19. Institutional protocols and algorithms that pertain to the evaluation of patients at risk for COVID-19 are in a state of rapid change based on information released  by regulatory bodies including the CDC and federal and state organizations. These policies and algorithms were followed during the patient's care in the ED.    Final Clinical Impressions(s) / ED Diagnoses   Final diagnoses:  None    ED Discharge Orders    None       Charlynne Pander, MD 07/01/19 1940

## 2019-07-01 NOTE — ED Notes (Signed)
Patient verbalized understanding of discharge instructions. Opportunity for questions were provided. Pt. ambulatory and discharged home.  

## 2019-07-01 NOTE — ED Triage Notes (Signed)
PT reports he has a HA and he tested positive for COVID on 06-11-2019. Pt reports he took tylenol and advil and it did not help HA.

## 2019-07-03 NOTE — Progress Notes (Signed)
Patient ID: Jeffrey Jensen, male   DOB: May 28, 1991, 28 y.o.   MRN: [id number]  Virtual Visit via Telephone Note  I connected with Jeffrey Jensen on 07/04/19 at  4:10 PM EDT by telephone and verified that I am speaking with the correct person using two identifiers.   I discussed the limitations, risks, security and privacy concerns of performing an evaluation and management service by telephone and the availability of in person appointments. I also discussed with the patient that there may be a patient responsible charge related to this service. The patient expressed understanding and agreed to proceed.  Patient location:  home My Location:  [facility] office Persons on the call:  Me and the patient  History of Present Illness: Seen in ED 07/01/2019 for Covid-19 infection.  He tested +for Covid 19 8/23 and 07/01/2019.  He is still having HA and fatigue.  No cough.  No SOB.  No fever.  96.9, 97.0.  No N/V/D. HA is back of neck and frontal.  Not relieved by tylenol.  Feels fatigued.     Observations/Objective:  NAD.  A&Ox3.   Assessment and Plan: 1. Intractable headache, unspecified chronicity pattern, unspecified headache type - methocarbamol (ROBAXIN) 500 MG tablet; Take 2 tablets (1,000 mg total) by mouth every 6 (six) hours as needed for muscle spasms.  Dispense: 90 tablet; Refill: 0  2. COVID-19 Improving but not resolved  3. Encounter for examination following treatment at hospital To ED if worsens or develops SOB, etc   Follow Up Instructions: Will do tele check again next week.  He will have someone pick up Rx and financial packet today.   I discussed the assessment and treatment plan with the patient. The patient was provided an opportunity to ask questions and all were answered. The patient agreed with the plan and demonstrated an understanding of the instructions.   The patient was advised to call back or seek an in-person evaluation if the symptoms worsen or if the condition fails to improve as  anticipated.  I provided 14 minutes of non-face-to-face time during this encounter.   Freeman Caldron, PA-C

## 2019-07-04 ENCOUNTER — Ambulatory Visit: Payer: Medicaid Other | Attending: Family Medicine | Admitting: Physician Assistant

## 2019-07-04 ENCOUNTER — Other Ambulatory Visit: Payer: Self-pay

## 2019-07-04 DIAGNOSIS — J988 Other specified respiratory disorders: Secondary | ICD-10-CM

## 2019-07-04 DIAGNOSIS — U071 COVID-19: Secondary | ICD-10-CM | POA: Diagnosis not present

## 2019-07-04 DIAGNOSIS — R7989 Other specified abnormal findings of blood chemistry: Secondary | ICD-10-CM | POA: Diagnosis not present

## 2019-07-04 DIAGNOSIS — Z09 Encounter for follow-up examination after completed treatment for conditions other than malignant neoplasm: Secondary | ICD-10-CM

## 2019-07-04 DIAGNOSIS — R51 Headache: Secondary | ICD-10-CM | POA: Diagnosis not present

## 2019-07-04 DIAGNOSIS — R519 Headache, unspecified: Secondary | ICD-10-CM

## 2019-07-04 MED ORDER — METHOCARBAMOL 500 MG PO TABS: 1000.0000 mg | ORAL_TABLET | Freq: Four times a day (QID) | ORAL | 0 refills | Status: AC | PRN

## 2019-07-04 MED FILL — METHOCARBAMOL 500 MG TABS: 500 | 11 days supply | Qty: 90 | Fill #0

## 2019-07-12 ENCOUNTER — Ambulatory Visit: Payer: Medicaid Other | Attending: Family Medicine | Admitting: Physician Assistant

## 2019-07-12 ENCOUNTER — Other Ambulatory Visit: Payer: Self-pay

## 2019-07-12 DIAGNOSIS — R7989 Other specified abnormal findings of blood chemistry: Secondary | ICD-10-CM

## 2019-07-12 DIAGNOSIS — Z20822 Contact with and (suspected) exposure to covid-19: Secondary | ICD-10-CM

## 2019-07-12 DIAGNOSIS — R945 Abnormal results of liver function studies: Secondary | ICD-10-CM | POA: Diagnosis not present

## 2019-07-12 DIAGNOSIS — R51 Headache: Secondary | ICD-10-CM | POA: Diagnosis not present

## 2019-07-12 DIAGNOSIS — U071 COVID-19: Secondary | ICD-10-CM

## 2019-07-12 DIAGNOSIS — J988 Other specified respiratory disorders: Secondary | ICD-10-CM

## 2019-07-12 DIAGNOSIS — R519 Headache, unspecified: Secondary | ICD-10-CM

## 2019-07-12 NOTE — Progress Notes (Signed)
Patient ID: Jeffrey Jensen, male   DOB: May 21, 1991, 28 y.o.   MRN: [id number] Virtual Visit via Telephone Note  I connected with Jeffrey Jensen on 07/12/19 at  3:10 PM EDT by telephone and verified that I am speaking with the correct person using two identifiers.   I discussed the limitations, risks, security and privacy concerns of performing an evaluation and management service by telephone and the availability of in person appointments. I also discussed with the patient that there may be a patient responsible charge related to this service. The patient expressed understanding and agreed to proceed.  Patient location:  home My Location:  [facility] office Persons on the call:  Me and the patient   History of Present Illness:  Originally started having symptoms of Covid-19 06/07/2019.  Tested + on 8/23 and 07/01/2019.  Symptoms still lingering.  Still having frequent HA and fatigue. No cough, runny nose, N/V/D.  He just doesn't feel back to himself.  Methocarbamol and tylenol only help a little with HA.  No vision changes.   No abdominal pain.  No fevers.  Temps running 97-98.2.      Observations/Objective:  NAD.  A&Ox3   Assessment and Plan: 1. COVID-19 - Comprehensive metabolic panel; Future - CBC with Differential/Platelet; Future-repeat Covid testing-he has had s/sx since 8/13 and 2 positive tests  2. Nonintractable headache, unspecified chronicity pattern, unspecified headache type - Comprehensive metabolic panel; Future - CBC with Differential/Platelet; Future - CK; Future -continue methocarbamol and tylenol for HA if LFT ok  3. Elevated LFTs - Comprehensive metabolic panel; Future  Follow Up Instructions: Assign PCP in 2 weeks   I discussed the assessment and treatment plan with the patient. The patient was provided an opportunity to ask questions and all were answered. The patient agreed with the plan and demonstrated an understanding of the instructions.   The patient was advised to call  back or seek an in-person evaluation if the symptoms worsen or if the condition fails to improve as anticipated.  I provided 12 minutes of non-face-to-face time during this encounter.   Freeman Caldron, PA-C

## 2019-07-14 LAB — NOVEL CORONAVIRUS, NAA: SARS-CoV-2, NAA: NOT DETECTED

## 2019-07-17 ENCOUNTER — Other Ambulatory Visit: Payer: Self-pay

## 2019-07-17 ENCOUNTER — Ambulatory Visit: Payer: Self-pay | Attending: Family Medicine

## 2019-07-17 DIAGNOSIS — U071 COVID-19: Secondary | ICD-10-CM

## 2019-07-17 DIAGNOSIS — R7989 Other specified abnormal findings of blood chemistry: Secondary | ICD-10-CM

## 2019-07-17 DIAGNOSIS — R519 Headache, unspecified: Secondary | ICD-10-CM

## 2019-07-18 LAB — CBC WITH DIFFERENTIAL/PLATELET
Basophils Absolute: 0 10*3/uL (ref 0.0–0.2)
Basos: 0 %
EOS (ABSOLUTE): 0.2 10*3/uL (ref 0.0–0.4)
Eos: 2 %
Hematocrit: 44.5 % (ref 37.5–51.0)
Hemoglobin: 14.3 g/dL (ref 13.0–17.7)
Immature Grans (Abs): 0 10*3/uL (ref 0.0–0.1)
Immature Granulocytes: 0 %
Lymphocytes Absolute: 2.9 10*3/uL (ref 0.7–3.1)
Lymphs: 30 %
MCH: 26.8 pg (ref 26.6–33.0)
MCHC: 32.1 g/dL (ref 31.5–35.7)
MCV: 83 fL (ref 79–97)
Monocytes Absolute: 0.5 10*3/uL (ref 0.1–0.9)
Monocytes: 6 %
Neutrophils Absolute: 5.8 10*3/uL (ref 1.4–7.0)
Neutrophils: 62 %
Platelets: 383 10*3/uL (ref 150–450)
RBC: 5.34 x10E6/uL (ref 4.14–5.80)
RDW: 15.5 % — ABNORMAL HIGH (ref 11.6–15.4)
WBC: 9.4 10*3/uL (ref 3.4–10.8)

## 2019-07-18 LAB — COMPREHENSIVE METABOLIC PANEL
ALT: 211 IU/L — ABNORMAL HIGH (ref 0–44)
AST: 47 IU/L — ABNORMAL HIGH (ref 0–40)
Albumin/Globulin Ratio: 1.5 (ref 1.2–2.2)
Albumin: 4.8 g/dL (ref 4.1–5.2)
Alkaline Phosphatase: 80 IU/L (ref 39–117)
BUN/Creatinine Ratio: 14 (ref 9–20)
BUN: 10 mg/dL (ref 6–20)
Bilirubin Total: 0.7 mg/dL (ref 0.0–1.2)
CO2: 23 mmol/L (ref 20–29)
Calcium: 10.4 mg/dL — ABNORMAL HIGH (ref 8.7–10.2)
Chloride: 99 mmol/L (ref 96–106)
Creatinine, Ser: 0.74 mg/dL — ABNORMAL LOW (ref 0.76–1.27)
GFR calc Af Amer: 147 mL/min/{1.73_m2} (ref >59)
GFR calc non Af Amer: 127 mL/min/{1.73_m2} (ref >59)
Globulin, Total: 3.2 g/dL (ref 1.5–4.5)
Glucose: 80 mg/dL (ref 65–99)
Potassium: 4.6 mmol/L (ref 3.5–5.2)
Sodium: 142 mmol/L (ref 134–144)
Total Protein: 8 g/dL (ref 6.0–8.5)

## 2019-07-18 LAB — CK: Total CK: 81 U/L (ref 49–439)

## 2019-07-25 ENCOUNTER — Observation Stay (HOSPITAL_COMMUNITY)
Admission: EM | Admit: 2019-07-25 | Discharge: 2019-07-26 | Disposition: A | Discharge status: Home / Self Care | Payer: Medicaid Other | Attending: Internal Medicine | Admitting: Internal Medicine

## 2019-07-25 ENCOUNTER — Emergency Department (HOSPITAL_COMMUNITY): Payer: Medicaid Other

## 2019-07-25 ENCOUNTER — Encounter (HOSPITAL_COMMUNITY): Payer: Self-pay | Admitting: Emergency Medicine

## 2019-07-25 ENCOUNTER — Other Ambulatory Visit: Payer: Self-pay

## 2019-07-25 DIAGNOSIS — U071 COVID-19: Principal | ICD-10-CM | POA: Insufficient documentation

## 2019-07-25 DIAGNOSIS — M6281 Muscle weakness (generalized): Secondary | ICD-10-CM | POA: Insufficient documentation

## 2019-07-25 DIAGNOSIS — Z0184 Encounter for antibody response examination: Secondary | ICD-10-CM | POA: Insufficient documentation

## 2019-07-25 HISTORY — DX: COVID-19: U07.1

## 2019-07-25 LAB — RESPIRATORY PANEL BY PCR

## 2019-07-25 LAB — CBC WITH DIFFERENTIAL/PLATELET
Abs Immature Granulocytes: 0.04 K/uL (ref 0.00–0.07)
Basophils Absolute: 0 K/uL (ref 0.0–0.1)
Basophils Relative: 0 %
Eosinophils Absolute: 0 K/uL (ref 0.0–0.5)
Eosinophils Relative: 0 %
HCT: 35.9 % — ABNORMAL LOW (ref 39.0–52.0)
Hemoglobin: 12.1 g/dL — ABNORMAL LOW (ref 13.0–17.0)
Immature Granulocytes: 0 %
Lymphocytes Relative: 15 %
Lymphs Abs: 1.3 K/uL (ref 0.7–4.0)
MCH: 27.4 pg (ref 26.0–34.0)
MCHC: 33.7 g/dL (ref 30.0–36.0)
MCV: 81.4 fL (ref 80.0–100.0)
Monocytes Absolute: 0.7 K/uL (ref 0.1–1.0)
Monocytes Relative: 8 %
Neutro Abs: 7 K/uL (ref 1.7–7.7)
Neutrophils Relative %: 77 %
Platelets: 290 K/uL (ref 150–400)
RBC: 4.41 MIL/uL (ref 4.22–5.81)
RDW: 14.1 % (ref 11.5–15.5)
WBC: 9 K/uL (ref 4.0–10.5)
nRBC: 0 % (ref 0.0–0.2)

## 2019-07-25 LAB — URINALYSIS, ROUTINE W REFLEX MICROSCOPIC
Bilirubin Urine: NEGATIVE
Glucose, UA: NEGATIVE mg/dL
Hgb urine dipstick: NEGATIVE
Ketones, ur: NEGATIVE mg/dL
Leukocytes,Ua: NEGATIVE
Nitrite: NEGATIVE
Protein, ur: NEGATIVE mg/dL
Specific Gravity, Urine: 1.009 (ref 1.005–1.030)
pH: 7 (ref 5.0–8.0)

## 2019-07-25 LAB — C-REACTIVE PROTEIN: CRP: 5.3 mg/dL — ABNORMAL HIGH (ref <1.0)

## 2019-07-25 LAB — COMPREHENSIVE METABOLIC PANEL WITH GFR
ALT: 145 U/L — ABNORMAL HIGH (ref 0–44)
AST: 66 U/L — ABNORMAL HIGH (ref 15–41)
Albumin: 3.5 g/dL (ref 3.5–5.0)
Alkaline Phosphatase: 57 U/L (ref 38–126)
Anion gap: 9 (ref 5–15)
BUN: 7 mg/dL (ref 6–20)
CO2: 24 mmol/L (ref 22–32)
Calcium: 8.6 mg/dL — ABNORMAL LOW (ref 8.9–10.3)
Chloride: 100 mmol/L (ref 98–111)
Creatinine, Ser: 0.67 mg/dL (ref 0.61–1.24)
GFR calc Af Amer: >60 mL/min
GFR calc non Af Amer: >60 mL/min
Glucose, Bld: 96 mg/dL (ref 70–99)
Potassium: 4.1 mmol/L (ref 3.5–5.1)
Sodium: 133 mmol/L — ABNORMAL LOW (ref 135–145)
Total Bilirubin: 1.3 mg/dL — ABNORMAL HIGH (ref 0.3–1.2)
Total Protein: 6.8 g/dL (ref 6.5–8.1)

## 2019-07-25 LAB — GROUP A STREP BY PCR: Group A Strep by PCR: NOT DETECTED

## 2019-07-25 LAB — D-DIMER, QUANTITATIVE: D-Dimer, Quant: 0.41 ug/mL-FEU (ref 0.00–0.50)

## 2019-07-25 LAB — FIBRINOGEN: Fibrinogen: 362 mg/dL (ref 210–475)

## 2019-07-25 LAB — PROCALCITONIN: Procalcitonin: 0.29 ng/mL

## 2019-07-25 LAB — BRAIN NATRIURETIC PEPTIDE: B Natriuretic Peptide: 78.8 pg/mL (ref 0.0–100.0)

## 2019-07-25 LAB — LACTATE DEHYDROGENASE: LDH: 207 U/L — ABNORMAL HIGH (ref 98–192)

## 2019-07-25 LAB — HIV ANTIBODY (ROUTINE TESTING W REFLEX): HIV Screen 4th Generation wRfx: NONREACTIVE

## 2019-07-25 LAB — INFLUENZA PANEL BY PCR (TYPE A & B)
Influenza A By PCR: NEGATIVE
Influenza B By PCR: NEGATIVE

## 2019-07-25 LAB — FERRITIN: Ferritin: 187 ng/mL (ref 24–336)

## 2019-07-25 LAB — TSH: TSH: 0.296 u[IU]/mL — ABNORMAL LOW (ref 0.350–4.500)

## 2019-07-25 LAB — SARS CORONAVIRUS 2 BY RT PCR (HOSPITAL ORDER, PERFORMED IN ~~LOC~~ HOSPITAL LAB): SARS Coronavirus 2: POSITIVE — AB

## 2019-07-25 MED ORDER — SODIUM CHLORIDE 0.9% FLUSH: 3.0000 mL | INTRAVENOUS | Status: DC | PRN

## 2019-07-25 MED ORDER — DOCUSATE SODIUM 100 MG PO CAPS
100.0000 mg | ORAL_CAPSULE | Freq: Two times a day (BID) | ORAL | Status: DC
  Administered 2019-07-25 – 2019-07-26 (×2): 100 mg via ORAL
  Filled 2019-07-25 (×2): qty 1

## 2019-07-25 MED ORDER — ENOXAPARIN SODIUM 40 MG/0.4ML ~~LOC~~ SOLN
40.0000 mg | SUBCUTANEOUS | Status: DC
  Filled 2019-07-25 (×2): qty 0.4

## 2019-07-25 MED ORDER — OXYCODONE HCL 5 MG PO TABS
5.0000 mg | ORAL_TABLET | ORAL | Status: DC | PRN
  Administered 2019-07-25 – 2019-07-26 (×2): 5 mg via ORAL
  Filled 2019-07-25 (×2): qty 1

## 2019-07-25 MED ORDER — DEXAMETHASONE SODIUM PHOSPHATE 10 MG/ML IJ SOLN
6.0000 mg | INTRAMUSCULAR | Status: DC
  Administered 2019-07-26: 6 mg via INTRAVENOUS
  Filled 2019-07-25: qty 1

## 2019-07-25 MED ORDER — POLYETHYLENE GLYCOL 3350 17 G PO PACK: 17.0000 g | PACK | Freq: Every day | ORAL | Status: DC | PRN

## 2019-07-25 MED ORDER — METHOCARBAMOL 500 MG PO TABS: 1000.0000 mg | ORAL_TABLET | Freq: Four times a day (QID) | ORAL | Status: DC | PRN

## 2019-07-25 MED ORDER — IOHEXOL 350 MG/ML SOLN
100.0000 mL | Freq: Once | INTRAVENOUS | Status: AC | PRN
  Administered 2019-07-25: 10:00:00 100 mL via INTRAVENOUS

## 2019-07-25 MED ORDER — BISACODYL 5 MG PO TBEC: 5.0000 mg | DELAYED_RELEASE_TABLET | Freq: Every day | ORAL | Status: DC | PRN

## 2019-07-25 MED ORDER — ONDANSETRON HCL 4 MG PO TABS: 4.0000 mg | ORAL_TABLET | Freq: Four times a day (QID) | ORAL | Status: DC | PRN

## 2019-07-25 MED ORDER — ACETAMINOPHEN 325 MG PO TABS
650.0000 mg | ORAL_TABLET | Freq: Four times a day (QID) | ORAL | Status: DC | PRN
  Administered 2019-07-25: 650 mg via ORAL
  Filled 2019-07-25: qty 2

## 2019-07-25 MED ORDER — SODIUM CHLORIDE 0.9 % IV SOLN: 250.0000 mL | INTRAVENOUS | Status: DC | PRN

## 2019-07-25 MED ORDER — ACETAMINOPHEN 500 MG PO TABS
1000.0000 mg | ORAL_TABLET | Freq: Once | ORAL | Status: AC
  Administered 2019-07-25: 1000 mg via ORAL
  Filled 2019-07-25: qty 2

## 2019-07-25 MED ORDER — SODIUM CHLORIDE 0.9% FLUSH: 3.0000 mL | Freq: Two times a day (BID) | INTRAVENOUS | Status: DC

## 2019-07-25 MED ORDER — ONDANSETRON HCL 4 MG/2ML IJ SOLN: 4.0000 mg | Freq: Four times a day (QID) | INTRAMUSCULAR | Status: DC | PRN

## 2019-07-25 MED ORDER — LACTATED RINGERS IV SOLN
INTRAVENOUS | Status: DC
  Administered 2019-07-25 – 2019-07-26 (×2): via INTRAVENOUS

## 2019-07-25 MED ORDER — SODIUM CHLORIDE 0.9 % IV BOLUS
1000.0000 mL | Freq: Once | INTRAVENOUS | Status: AC
  Administered 2019-07-25: 1000 mL via INTRAVENOUS

## 2019-07-25 MED ORDER — DEXAMETHASONE SODIUM PHOSPHATE 10 MG/ML IJ SOLN
10.0000 mg | Freq: Once | INTRAMUSCULAR | Status: AC
  Administered 2019-07-25: 10 mg via INTRAVENOUS
  Filled 2019-07-25: qty 1

## 2019-07-25 NOTE — ED Notes (Signed)
Pt transported to [facility] for admission via [employer]

## 2019-07-25 NOTE — H&P (Signed)
History and Physical    Jeffrey Caneham Laske [id number] DOB: December 14, 1990 DOA: 07/25/2019  PCP: Patient, No Pcp Per Consultants:  None Patient coming from:  Home; NOK: Wife, Ralph DowdyYou Rah, [phone]  Chief Complaint: fever  HPI: Jeffrey Jensen is a 28 y.o. male with without prior significant medical history significant of presenting with persistent symptoms of COVID-19 infection.  Patient was + for COVID-19 infection on 8/17, 8/23 and 9/6.  He has continued to have ongoing symptoms.  He reports prior symptoms of headache, fever, fatigue and was initially positive on 8/17.  He likely was infected by his parents, who both had symptoms before him.  He was seen in the ER on 8/23 and reported 2 weeks of symptoms.  At that visit, he continued with severe fatigue; he was discharged with self-quarantine after IVF.  He returned to the ER on 9/6 with c/o persistent headache.  Labs showed an ongoing positive test and was told to continue to quarantine for another 2 weeks and then discharged.   He called in with a recurrent headache and was given Robaxin on 9/9.  He reported lingering symptoms again on 9/17, but had a negative test for COVID then.  His symptoms significantly worsened last night with fever and sore throat.      ED Course:  COVID + last month with persistent headache.  2 days ago with recurrent fever, sore throat.  No in distress other than tachypnea to 30s at rest without hypoxia.  Review of Systems: As per HPI; otherwise review of systems reviewed and negative.   Ambulatory Status:  Ambulates without assistance  Past Medical History:  Diagnosis Date   COVID-19     History reviewed. No pertinent surgical history.  Social History   Socioeconomic History   Marital status: Single    Spouse name: Not on file   Number of children: Not on file   Years of education: Not on file   Highest education level: Not on file  Occupational History   Not on file  Social Needs   Financial resource strain:  Not on file   Food insecurity    Worry: Not on file    Inability: Not on file   Transportation needs    Medical: Not on file    Non-medical: Not on file  Tobacco Use   Smoking status: Never Smoker   Smokeless tobacco: Never Used  Substance and Sexual Activity   Alcohol use: No   Drug use: No   Sexual activity: Not on file  Lifestyle   Physical activity    Days per week: Not on file    Minutes per session: Not on file   Stress: Not on file  Relationships   Social connections    Talks on phone: Not on file    Gets together: Not on file    Attends religious service: Not on file    Active member of club or organization: Not on file    Attends meetings of clubs or organizations: Not on file    Relationship status: Not on file   Intimate partner violence    Fear of current or ex partner: Not on file    Emotionally abused: Not on file    Physically abused: Not on file    Forced sexual activity: Not on file  Other Topics Concern   Not on file  Social History Narrative   Not on file    No Known Allergies  History reviewed. No pertinent family history.  Prior to Admission medications   Medication Sig Start Date End Date Taking? Authorizing Provider  acetaminophen (TYLENOL) 500 MG tablet Take 2 tablets (1,000 mg total) by mouth every 6 (six) hours as needed for moderate pain or fever. Do not exceed 4000 mg total in 24 hours 09/01/16   Delsa Grana, PA-C  methocarbamol (ROBAXIN) 500 MG tablet Take 2 tablets (1,000 mg total) by mouth every 6 (six) hours as needed for muscle spasms. 07/04/19   Argentina Donovan, PA-C    Physical Exam: Vitals:   07/25/19 1145 07/25/19 1200 07/25/19 1215 07/25/19 1230  BP: 125/88 116/70 125/81 127/76  Pulse: 88 77 83 75  Resp: (!) 22 (!) 26 (!) 34 (!) 23  Temp:      TempSrc:      SpO2: 97% 97% 97% 97%  Weight:      Height:          General:  Appears calm and comfortable and is NAD  Eyes:  PERRL, EOMI, normal lids,  iris  ENT:  grossly normal hearing, lips & tongue, mmm  Neck:  no LAD, masses or thyromegaly  Cardiovascular:  RRR, no m/r/g. No LE edema.   Respiratory:   CTA bilaterally with no wheezes/rales/rhonchi. Mildly increased respiratory effort.  Abdomen:  soft, NT, ND, NABS  Back:   normal alignment, no CVAT  Skin:  no rash or induration seen on limited exam  Musculoskeletal:  grossly normal tone BUE/BLE, good ROM, no bony abnormality  Psychiatric:  grossly normal mood and affect, speech fluent and appropriate, AOx3  Neurologic:  CN 2-12 grossly intact, moves all extremities in coordinated fashion, sensation intact    Radiological Exams on Admission: Ct Angio Chest Pe W And/or Wo Contrast  Result Date: 07/25/2019 CLINICAL DATA:  28 year old male with history of shortness of breath. Fever for the past 2 days. Evaluate for potential pulmonary embolism. EXAM: CT ANGIOGRAPHY CHEST WITH CONTRAST TECHNIQUE: Multidetector CT imaging of the chest was performed using the standard protocol during bolus administration of intravenous contrast. Multiplanar CT image reconstructions and MIPs were obtained to evaluate the vascular anatomy. CONTRAST:  122mL OMNIPAQUE IOHEXOL 350 MG/ML SOLN COMPARISON:  None. FINDINGS: Cardiovascular: No filling defects are noted within the pulmonary arterial tree to suggest underlying pulmonary embolism. Heart size is normal. There is no significant pericardial fluid, thickening or pericardial calcification. No atherosclerotic calcifications in the thoracic aorta or the coronary arteries. Mediastinum/Nodes: No pathologically enlarged mediastinal or hilar lymph nodes. Esophagus is unremarkable in appearance. No axillary lymphadenopathy. Lungs/Pleura: No acute consolidative airspace disease. No pleural effusions. No suspicious appearing pulmonary nodules or masses are noted. Upper Abdomen: Unremarkable. Musculoskeletal: There are no aggressive appearing lytic or blastic lesions  noted in the visualized portions of the skeleton. Review of the MIP images confirms the above findings. IMPRESSION: 1. No evidence of pulmonary embolism. 2. No acute findings in the thorax to account for the patient's symptoms. Electronically Signed   By: Vinnie Langton M.D.   On: 07/25/2019 10:40   Dg Chest Portable 1 View  Result Date: 07/25/2019 CLINICAL DATA:  Fever and headache.  History of COVID-19 positive EXAM: PORTABLE CHEST 1 VIEW COMPARISON:  July 01, 2019. FINDINGS: Lungs are clear. The heart size and pulmonary vascularity are normal. No adenopathy. No bone lesions. IMPRESSION: No edema or consolidation.  Stable cardiac silhouette. Electronically Signed   By: Lowella Grip III M.D.   On: 07/25/2019 08:12    EKG: not done   Labs on Admission: I  have personally reviewed the available labs and imaging studies at the time of the admission.  Pertinent labs:   Na++ 133 AST 66/ALT 145/Bili 1.3 WBC 9.0 Hgb 12.1 Flu negative Group A strep negative COVID POSITIVE today and also 9/6 and 8/17; he was negative on 9/17  Assessment/Plan Active Problems:   COVID-19 virus infection   COVID-19 virus infection -Young man without known chronic medical problems with persistent/recurrent symptoms associated with COVID-19 infection for 6 weeks -It is atypical for the symptoms to persist/recur for such a long period of time -However, he has fever and sore throat with a POSITIVE test for COVID (?non-infectious but that is not clear) without other apparent source -He does not have a clear reason for inpatient hospitalization or treatment, but it is reasonable to observe at [facility] given persistent tachypnea in the setting of concern for ongoing COVID-19 infection -Pertinent labs concerning for COVID include normal WBC count; increased LFTs; other inflammatory markers are pending -CXR and CT chest are negative, despite persistent tachypnea -Monitor on telemetry x at least 24 hours -At this  time, will attempt to avoid use of aerosolized medications and use HFAs instead -Will check daily labs including BMP with Mag, Phos; LFTs; CBC with differential; CRP; ferritin; fibrinogen; D-dimer -Will attempt to maintain euvolemia to a net negative fluid status -Will ask the patient to maintain an awake prone position for 16+ hours a day, if possible, with a minimum of 2-3 hours at a time -Patient was seen wearing full PPE including: gown, gloves, head cover, N95, and face shield; donning and doffing was in compliance with current standards.    DVT prophylaxis:  Lovenox  Code Status:  Full  Family Communication: None present Disposition Plan:  Home once clinically improved Consults called: None  Admission status: It is my clinical opinion that referral for OBSERVATION is reasonable and necessary in this patient based on the above information provided. The aforementioned taken together are felt to place the patient at high risk for further clinical deterioration. However it is anticipated that the patient may be medically stable for discharge from the hospital within 24 to 48 hours.    Jonah Blue MD [facility]   How to contact the [facility] Attending or Consulting provider 7A - 7P or covering provider during after hours 7P -7A, for this patient?  1. Check the care team in [facility] and look for a) attending/consulting TR[facility] provider listed and b) the [facility] team listed 2. Log into [email or web address] and use [facility] universal password to access. If you do not have the password, please contact the hospital operator. 3. Locate the [facility] provider you are looking for under [facility] and page to a number that you can be directly reached. 4. If you still have difficulty reaching the provider, please page the [facility] (Director on Call) for the Hospitalists listed on amion for assistance.   07/25/2019, 1:13 PM

## 2019-07-25 NOTE — ED Notes (Signed)
Patient transported to CT 

## 2019-07-25 NOTE — ED Triage Notes (Signed)
Pt was covid positive in august- started running a high fever again yesterday-- has had a headache since august- sore throat now. Has not taken any medicine for fever today

## 2019-07-25 NOTE — Plan of Care (Signed)
  Problem: Activity: Goal: Risk for activity intolerance will decrease Outcome: Progressing   

## 2019-07-25 NOTE — ED Provider Notes (Signed)
[facility] Provider Note   CSN: [id number] Arrival date & time: 07/25/19  0706     History   Chief Complaint Chief Complaint  Patient presents with  . Fever    HPI Jeffrey Jensen is a 28 y.o. male presenting for evaluation of fever and sore throat.  Patient states his symptoms began 2 days ago.  He developed a high fever and sore throat.  He reports associated decreased appetite.  He denies nasal congestion, chest pain, shortness of breath, cough, nausea, vomiting, domino pain, urinary symptoms, abnormal bowel movements.  Patient states he was having intermittent headaches starting August 13, he was tested for COVID a total of 4 times.  The first 3 tests are positive, his most recent test 13 days ago was negative.  Patient states throughout his positive test, he was not having any fever or respiratory symptoms.  Patient states he has no other medical problems, takes no medications daily.  He has been taking ibuprofen as needed for his headache.  He also been taking DayQuil, but has not had any ibuprofen or DayQuil today.  He has no history of lung problems including asthma or COPD.  He denies tobacco, alcohol, drug use.  He denies sick contacts.     HPI  Past Medical History:  Diagnosis Date  . COVID-19     There are no active problems to display for this patient.   History reviewed. No pertinent surgical history.      Home Medications    Prior to Admission medications   Medication Sig Start Date End Date Taking? Authorizing Provider  acetaminophen (TYLENOL) 500 MG tablet Take 2 tablets (1,000 mg total) by mouth every 6 (six) hours as needed for moderate pain or fever. Do not exceed 4000 mg total in 24 hours 09/01/16   Delsa Grana, PA-C  methocarbamol (ROBAXIN) 500 MG tablet Take 2 tablets (1,000 mg total) by mouth every 6 (six) hours as needed for muscle spasms. 07/04/19   Argentina Donovan, PA-C    Family History No family history on  file.  Social History Social History   Tobacco Use  . Smoking status: Never Smoker  . Smokeless tobacco: Never Used  Substance Use Topics  . Alcohol use: No  . Drug use: No     Allergies   Patient has no known allergies.   Review of Systems Review of Systems  Constitutional: Positive for appetite change and fever.  HENT: Positive for sore throat.   All other systems reviewed and are negative.    Physical Exam Updated Vital Signs BP 121/75   Pulse 80   Temp 100 F (37.8 C) (Oral)   Resp (!) 22   Ht 5\' 3"  (1.6 m)   Wt 63.5 kg   SpO2 96%   BMI 24.80 kg/m   Physical Exam Vitals signs and nursing note reviewed.  Constitutional:      Appearance: He is well-developed.     Comments: Appears as if he feels unwell, but in no acute distress  HENT:     Head: Normocephalic and atraumatic.     Comments: Erythema of oropharynx with bilateral tonsillar hypertrophy.  No obvious exudate.  Uvula midline with palate rise.  No trismus.  Handling secretions well.  No muffled voice.    Mouth/Throat:     Pharynx: Uvula midline. Posterior oropharyngeal erythema present.     Tonsils: No tonsillar exudate. 3+ on the right. 3+ on the left.  Eyes:  Conjunctiva/sclera: Conjunctivae normal.     Pupils: Pupils are equal, round, and reactive to light.  Neck:     Musculoskeletal: Normal range of motion and neck supple.  Cardiovascular:     Rate and Rhythm: Regular rhythm. Tachycardia present.     Pulses: Normal pulses.     Comments: Tachycardic around 115 Pulmonary:     Effort: Pulmonary effort is normal. Tachypnea present. No respiratory distress.     Breath sounds: Normal breath sounds. No wheezing.     Comments: Speaking in full sentences.  Clear lung sounds in all fields.  Patient is tachypneic up to 35 while talking. SpO2 95-100% on RA Abdominal:     General: There is no distension.     Palpations: Abdomen is soft. There is no mass.     Tenderness: There is abdominal  tenderness. There is no guarding or rebound.     Comments: Generalized tenderness to palpation the abdomen, worse in the left lower quadrant.  Abdomen soft without rigidity, guarding, distention.  Negative rebound.  Musculoskeletal: Normal range of motion.  Skin:    General: Skin is warm and dry.     Capillary Refill: Capillary refill takes less than 2 seconds.  Neurological:     Mental Status: He is alert and oriented to person, place, and time.      ED Treatments / Results  Labs (all labs ordered are listed, but only abnormal results are displayed) Labs Reviewed  SARS CORONAVIRUS 2 (HOSPITAL ORDER, PERFORMED IN [facility]) - Abnormal; Notable for the following components:      Result Value   SARS Coronavirus 2 POSITIVE (*)    All other components within normal limits  URINALYSIS, ROUTINE W REFLEX MICROSCOPIC - Abnormal; Notable for the following components:   Color, Urine STRAW (*)    All other components within normal limits  CBC WITH DIFFERENTIAL/PLATELET - Abnormal; Notable for the following components:   Hemoglobin 12.1 (*)    HCT 35.9 (*)    All other components within normal limits  COMPREHENSIVE METABOLIC PANEL - Abnormal; Notable for the following components:   Sodium 133 (*)    Calcium 8.6 (*)    AST 66 (*)    ALT 145 (*)    Total Bilirubin 1.3 (*)    All other components within normal limits  GROUP A STREP BY PCR  INFLUENZA PANEL BY PCR (TYPE A & B)  CBC WITH DIFFERENTIAL/PLATELET    EKG None  Radiology Ct Angio Chest Pe W And/or Wo Contrast  Result Date: 07/25/2019 CLINICAL DATA:  28 year old male with history of shortness of breath. Fever for the past 2 days. Evaluate for potential pulmonary embolism. EXAM: CT ANGIOGRAPHY CHEST WITH CONTRAST TECHNIQUE: Multidetector CT imaging of the chest was performed using the standard protocol during bolus administration of intravenous contrast. Multiplanar CT image reconstructions and MIPs were obtained to  evaluate the vascular anatomy. CONTRAST:  100mL OMNIPAQUE IOHEXOL 350 MG/ML SOLN COMPARISON:  None. FINDINGS: Cardiovascular: No filling defects are noted within the pulmonary arterial tree to suggest underlying pulmonary embolism. Heart size is normal. There is no significant pericardial fluid, thickening or pericardial calcification. No atherosclerotic calcifications in the thoracic aorta or the coronary arteries. Mediastinum/Nodes: No pathologically enlarged mediastinal or hilar lymph nodes. Esophagus is unremarkable in appearance. No axillary lymphadenopathy. Lungs/Pleura: No acute consolidative airspace disease. No pleural effusions. No suspicious appearing pulmonary nodules or masses are noted. Upper Abdomen: Unremarkable. Musculoskeletal: There are no aggressive appearing lytic or blastic  lesions noted in the visualized portions of the skeleton. Review of the MIP images confirms the above findings. IMPRESSION: 1. No evidence of pulmonary embolism. 2. No acute findings in the thorax to account for the patient's symptoms. Electronically Signed   By: Trudie Reed M.D.   On: 07/25/2019 10:40   Dg Chest Portable 1 View  Result Date: 07/25/2019 CLINICAL DATA:  Fever and headache.  History of COVID-19 positive EXAM: PORTABLE CHEST 1 VIEW COMPARISON:  July 01, 2019. FINDINGS: Lungs are clear. The heart size and pulmonary vascularity are normal. No adenopathy. No bone lesions. IMPRESSION: No edema or consolidation.  Stable cardiac silhouette. Electronically Signed   By: Bretta Bang III M.D.   On: 07/25/2019 08:12    Procedures Procedures (including critical care time)  Medications Ordered in ED Medications  sodium chloride 0.9 % bolus 1,000 mL (0 mLs Intravenous Stopped 07/25/19 1012)  acetaminophen (TYLENOL) tablet 1,000 mg (1,000 mg Oral Given 07/25/19 0759)  dexamethasone (DECADRON) injection 10 mg (10 mg Intravenous Given 07/25/19 1010)  iohexol (OMNIPAQUE) 350 MG/ML injection 100 mL (100  mLs Intravenous Contrast Given 07/25/19 1022)     Initial Impression / Assessment and Plan / ED Course  I have reviewed the triage vital signs and the nursing notes.  Pertinent labs & imaging results that were available during my care of the patient were reviewed by me and considered in my medical decision making (see chart for details).  Clinical Course as of Jul 24 1149  Wed Jul 25, 2019  1054 Total Protein: 6.8 [KF]    Clinical Course User Index [KF] Dartha Lodge, [place]       Patient presenting for evaluation of 2-day history of fever and sore throat.  Physical exam shows patient who appears as if he feels unwell, but is in no distress.  While he has had recent positive COVID test, his most recent one was negative.  As he is having new symptoms which began 2 days ago, consider new etiology.  As he had a sore throat, tonsillar swelling, and fever, consider strep.  Also consider flu or other viral illness.  However, I am concerned about patients tachypnea.  As such we will obtain x-ray.  Patient with generalized abdominal tenderness.  This could be due to strep throat or other viral illness, however will also obtain labs.  Fluids and Tylenol given for heart rate and symptom control.  Labs reassuring.  Liver enzymes slightly elevated, consistent with a viral illness.  No leukocytosis.  X-ray viewed interpreted by me, no pneumonia, possible effusion, cardiomegaly.  Strep negative.  Flu pending.  On reassessment after fluids and Tylenol, patient remains tachypneic.  Concern for PE.  Will obtain CTA.  Flu negative.  CTA negative.  COVID ordered.  Covid test positive.  Consider false negative 2 weeks ago versus reinfection  On reassessment, patient sleeping/at rest in the bed in the room, but remains tachypneic in the 30s.  He has remained this way throughout multiple reassessments.  I am concerned about his respiratory status.  As such, will call for admission.  Discussed with Dr. Audley Hose and  [facility] hospitalist service, patient to be admitted.   Final Clinical Impressions(s) / ED Diagnoses   Final diagnoses:  COVID-19    ED Discharge Orders    None       Alveria Apley, PA-C 07/25/19 1150    Tilden Fossa, MD 07/26/19 [phone]

## 2019-07-26 DIAGNOSIS — U071 COVID-19: Secondary | ICD-10-CM | POA: Diagnosis not present

## 2019-07-26 LAB — CBC WITH DIFFERENTIAL/PLATELET
Abs Immature Granulocytes: 0.04 10*3/uL (ref 0.00–0.07)
Basophils Absolute: 0 10*3/uL (ref 0.0–0.1)
Basophils Relative: 0 %
Eosinophils Absolute: 0 10*3/uL (ref 0.0–0.5)
Eosinophils Relative: 0 %
HCT: 38 % — ABNORMAL LOW (ref 39.0–52.0)
Hemoglobin: 12.3 g/dL — ABNORMAL LOW (ref 13.0–17.0)
Immature Granulocytes: 1 %
Lymphocytes Relative: 18 %
Lymphs Abs: 1.6 10*3/uL (ref 0.7–4.0)
MCH: 26.5 pg (ref 26.0–34.0)
MCHC: 32.4 g/dL (ref 30.0–36.0)
MCV: 81.9 fL (ref 80.0–100.0)
Monocytes Absolute: 0.7 10*3/uL (ref 0.1–1.0)
Monocytes Relative: 7 %
Neutro Abs: 6.5 10*3/uL (ref 1.7–7.7)
Neutrophils Relative %: 74 %
Platelets: 342 10*3/uL (ref 150–400)
RBC: 4.64 MIL/uL (ref 4.22–5.81)
RDW: 13.9 % (ref 11.5–15.5)
WBC: 8.8 10*3/uL (ref 4.0–10.5)
nRBC: 0 % (ref 0.0–0.2)

## 2019-07-26 LAB — COMPREHENSIVE METABOLIC PANEL
ALT: 168 U/L — ABNORMAL HIGH (ref 0–44)
AST: 55 U/L — ABNORMAL HIGH (ref 15–41)
Albumin: 3.8 g/dL (ref 3.5–5.0)
Alkaline Phosphatase: 62 U/L (ref 38–126)
Anion gap: 10 (ref 5–15)
BUN: 11 mg/dL (ref 6–20)
CO2: 26 mmol/L (ref 22–32)
Calcium: 9.7 mg/dL (ref 8.9–10.3)
Chloride: 102 mmol/L (ref 98–111)
Creatinine, Ser: 0.65 mg/dL (ref 0.61–1.24)
GFR calc Af Amer: >60 mL/min (ref >60)
GFR calc non Af Amer: >60 mL/min (ref >60)
Glucose, Bld: 143 mg/dL — ABNORMAL HIGH (ref 70–99)
Potassium: 4 mmol/L (ref 3.5–5.1)
Sodium: 138 mmol/L (ref 135–145)
Total Bilirubin: 0.3 mg/dL (ref 0.3–1.2)
Total Protein: 7.5 g/dL (ref 6.5–8.1)

## 2019-07-26 LAB — TYPE AND SCREEN
ABO/RH(D): A POS
Antibody Screen: NEGATIVE

## 2019-07-26 LAB — C-REACTIVE PROTEIN: CRP: 5.7 mg/dL — ABNORMAL HIGH (ref <1.0)

## 2019-07-26 LAB — LACTATE DEHYDROGENASE: LDH: 162 U/L (ref 98–192)

## 2019-07-26 LAB — ABO/RH: ABO/RH(D): A POS

## 2019-07-26 LAB — FERRITIN: Ferritin: 183 ng/mL (ref 24–336)

## 2019-07-26 LAB — MAGNESIUM: Magnesium: 2.3 mg/dL (ref 1.7–2.4)

## 2019-07-26 LAB — D-DIMER, QUANTITATIVE: D-Dimer, Quant: <0.27 ug/mL-FEU (ref 0.00–0.50)

## 2019-07-26 LAB — BRAIN NATRIURETIC PEPTIDE: B Natriuretic Peptide: 102.3 pg/mL — ABNORMAL HIGH (ref 0.0–100.0)

## 2019-07-26 MED ORDER — METHYLPREDNISOLONE 4 MG PO TBPK: ORAL_TABLET | ORAL | 0 refills | Status: AC

## 2019-07-26 NOTE — Discharge Instructions (Signed)
Follow with Primary MD in 7 days   Get CBC, CMP, 2 view Chest X ray -  checked next visit within 1 week by Primary MD    Activity: As tolerated with Full fall precautions use walker/cane & assistance as needed  Disposition Home    Diet: Heart Healthy    Special Instructions: If you have smoked or chewed Tobacco  in the last 2 yrs please stop smoking, stop any regular Alcohol  and or any Recreational drug use.  On your next visit with your primary care physician please Get Medicines reviewed and adjusted.  Please request your Prim.MD to go over all Hospital Tests and Procedure/Radiological results at the follow up, please get all Hospital records sent to your Prim MD by signing hospital release before you go home.  If you experience worsening of your admission symptoms, develop shortness of breath, life threatening emergency, suicidal or homicidal thoughts you must seek medical attention immediately by calling 911 or calling your MD immediately  if symptoms less severe.  You Must read complete instructions/literature along with all the possible adverse reactions/side effects for all the Medicines you take and that have been prescribed to you. Take any new Medicines after you have completely understood and accpet all the possible adverse reactions/side effects.   Do not drive, operate heavy machinery, perform activities at heights, swimming or participation in water activities or provide baby sitting services if your were admitted for syncope or siezures until you have seen by Primary MD or a Neurologist and advised to do so again.      Person Under Monitoring Name: Jeffrey Jensen  Location: [place] [address]   Infection Prevention Recommendations for Individuals Confirmed to have, or Being Evaluated for, 2019 Novel Coronavirus (COVID-19) Infection Who Receive Care at Home  Individuals who are confirmed to have, or are being evaluated for, COVID-19 should follow the  prevention steps below until a healthcare provider or local or state health department says they can return to normal activities.  Stay home except to get medical care You should restrict activities outside your home, except for getting medical care. Do not go to work, school, or public areas, and do not use public transportation or taxis.  Call ahead before visiting your doctor Before your medical appointment, call the healthcare provider and tell them that you have, or are being evaluated for, COVID-19 infection. This will help the healthcare providers office take steps to keep other people from getting infected. Ask your healthcare provider to call the local or state health department.  Monitor your symptoms Seek prompt medical attention if your illness is worsening (e.g., difficulty breathing). Before going to your medical appointment, call the healthcare provider and tell them that you have, or are being evaluated for, COVID-19 infection. Ask your healthcare provider to call the local or state health department.  Wear a facemask You should wear a facemask that covers your nose and mouth when you are in the same room with other people and when you visit a healthcare provider. People who live with or visit you should also wear a facemask while they are in the same room with you.  Separate yourself from other people in your home As much as possible, you should stay in a different room from other people in your home. Also, you should use a separate bathroom, if available.  Avoid sharing household items You should not share dishes, drinking glasses, cups, eating utensils, towels, bedding, or other items with  other people in your home. After using these items, you should wash them thoroughly with soap and water.  Cover your coughs and sneezes Cover your mouth and nose with a tissue when you cough or sneeze, or you can cough or sneeze into your sleeve. Throw used tissues in a lined  trash can, and immediately wash your hands with soap and water for at least 20 seconds or use an alcohol-based hand rub.  Wash your [employer] Corporationhands Wash your hands often and thoroughly with soap and water for at least 20 seconds. You can use an alcohol-based hand sanitizer if soap and water are not available and if your hands are not visibly dirty. Avoid touching your eyes, nose, and mouth with unwashed hands.   Prevention Steps for Caregivers and Household Members of Individuals Confirmed to have, or Being Evaluated for, COVID-19 Infection Being Cared for in the Home  If you live with, or provide care at home for, a person confirmed to have, or being evaluated for, COVID-19 infection please follow these guidelines to prevent infection:  Follow healthcare providers instructions Make sure that you understand and can help the patient follow any healthcare provider instructions for all care.  Provide for the patients basic needs You should help the patient with basic needs in the home and provide support for getting groceries, prescriptions, and other personal needs.  Monitor the patients symptoms If they are getting sicker, call his or her medical provider and tell them that the patient has, or is being evaluated for, COVID-19 infection. This will help the healthcare providers office take steps to keep other people from getting infected. Ask the healthcare provider to call the local or state health department.  Limit the number of people who have contact with the patient  If possible, have only one caregiver for the patient.  Other household members should stay in another home or place of residence. If this is not possible, they should stay  in another room, or be separated from the patient as much as possible. Use a separate bathroom, if available.  Restrict visitors who do not have an essential need to be in the home.  Keep older adults, very young children, and other sick people away  from the patient Keep older adults, very young children, and those who have compromised immune systems or chronic health conditions away from the patient. This includes people with chronic heart, lung, or kidney conditions, diabetes, and cancer.  Ensure good ventilation Make sure that shared spaces in the home have good air flow, such as from an air conditioner or an opened window, weather permitting.  Wash your hands often  Wash your hands often and thoroughly with soap and water for at least 20 seconds. You can use an alcohol based hand sanitizer if soap and water are not available and if your hands are not visibly dirty.  Avoid touching your eyes, nose, and mouth with unwashed hands.  Use disposable paper towels to dry your hands. If not available, use dedicated cloth towels and replace them when they become wet.  Wear a facemask and gloves  Wear a disposable facemask at all times in the room and gloves when you touch or have contact with the patients blood, body fluids, and/or secretions or excretions, such as sweat, saliva, sputum, nasal mucus, vomit, urine, or feces.  Ensure the mask fits over your nose and mouth tightly, and do not touch it during use.  Throw out disposable facemasks and gloves after using them. Do  not reuse.  Wash your hands immediately after removing your facemask and gloves.  If your personal clothing becomes contaminated, carefully remove clothing and launder. Wash your hands after handling contaminated clothing.  Place all used disposable facemasks, gloves, and other waste in a lined container before disposing them with other household waste.  Remove gloves and wash your hands immediately after handling these items.  Do not share dishes, glasses, or other household items with the patient  Avoid sharing household items. You should not share dishes, drinking glasses, cups, eating utensils, towels, bedding, or other items with a patient who is confirmed to  have, or being evaluated for, COVID-19 infection.  After the person uses these items, you should wash them thoroughly with soap and water.  Wash laundry thoroughly  Immediately remove and wash clothes or bedding that have blood, body fluids, and/or secretions or excretions, such as sweat, saliva, sputum, nasal mucus, vomit, urine, or feces, on them.  Wear gloves when handling laundry from the patient.  Read and follow directions on labels of laundry or clothing items and detergent. In general, wash and dry with the warmest temperatures recommended on the label.  Clean all areas the individual has used often  Clean all touchable surfaces, such as counters, tabletops, doorknobs, bathroom fixtures, toilets, phones, keyboards, tablets, and bedside tables, every day. Also, clean any surfaces that may have blood, body fluids, and/or secretions or excretions on them.  Wear gloves when cleaning surfaces the patient has come in contact with.  Use a diluted bleach solution (e.g., dilute bleach with 1 part bleach and 10 parts water) or a household disinfectant with a label that says EPA-registered for coronaviruses. To make a bleach solution at home, add 1 tablespoon of bleach to 1 quart (4 cups) of water. For a larger supply, add  cup of bleach to 1 gallon (16 cups) of water.  Read labels of cleaning products and follow recommendations provided on product labels. Labels contain instructions for safe and effective use of the cleaning product including precautions you should take when applying the product, such as wearing gloves or eye protection and making sure you have good ventilation during use of the product.  Remove gloves and wash hands immediately after cleaning.  Monitor yourself for signs and symptoms of illness Caregivers and household members are considered close contacts, should monitor their health, and will be asked to limit movement outside of the home to the extent possible. Follow  the monitoring steps for close contacts listed on the symptom monitoring form.   ? If you have additional questions, contact your local health department or call the epidemiologist on call at [phone] (available 24/7). ? This guidance is subject to change. For the most up-to-date guidance from [facility], please refer to their website: [email or web address]

## 2019-07-26 NOTE — Progress Notes (Signed)
Pt stable for discharge. No signs of distress noted. Pt escorted to car via wheelchair. Pt educated on proper COVID safety. No questions or concerns at this time. Will continue to monitor.

## 2019-07-26 NOTE — Discharge Summary (Signed)
Jeffrey Jensen:[id number] DOB: [birth year] DOA: 07/25/2019  PCP: Patient, No Pcp Per  Admit date: 07/25/2019  Discharge date: 07/26/2019  Admitted From: Home   Disposition:  Home   Recommendations for Outpatient Follow-up:   Follow up with PCP in 1-2 weeks  PCP Please obtain BMP/CBC, 2 view CXR in 1week,  (see Discharge instructions)   PCP Please follow up on the following pending results: Check CMP in 2 to 3 weeks.   Home Health: None   Equipment/Devices: None  Consultations: None None Discharge Condition: Stable    CODE STATUS: Full    Diet Recommendation: Heart Healthy     Chief Complaint  Patient presents with   Fever     Brief history of present illness from the day of admission and additional interim summary    Jeffrey Jensen is a 28 y.o. male with without prior significant medical history significant of presenting with persistent symptoms of COVID-19 infection.  Patient was + for COVID-19 infection on 8/17, 8/23 and 9/6.  He has continued to have ongoing symptoms.  He reports prior symptoms of headache, fever, fatigue.  In the ER he was found to be mildly dehydrated and kept in 23-hour observation.                                                                 Hospital Course    1.  Headache and dehydration.  Due to mild COVID-19 infection which was diagnosed few weeks ago in the outpatient setting with minimally elevated CRP, he had no pulmonary symptoms and a negative CT chest.  He did have mild transaminitis which could be related to COVID-19 infection, with stable total bilirubin and albumin.  Synthetic function of the liver likely stable.  He denies any history of alcohol use of diabetes mellitus and is not morbidly obese.  He was treated with IV fluids for dehydration this morning he is completely  symptom-free afebrile, he will be given a short course of steroids and requested to follow with PCP in 7 to 10 days for a repeat CMP.  If his liver enzymes are still elevated at that point further work-up can be conducted by PCP.  Will be discharged home today.  Request PCP to check CMP in a week if liver enzymes still elevated consider further hepatitis work-up and possible outpatient GI follow-up.  I have stopped his outpatient pseudoephedrine and Tylenol use which he denies he used excessively.  COVID-19 Labs  Recent Labs    07/25/19 1342 07/25/19 1400 07/26/19 0232  DDIMER  --  0.41 <0.27  FERRITIN 187  --   --   LDH  --  207* 162  CRP 5.3*  --   --     Lab Results  Component Value Date   SARSCOV2NAA POSITIVE (A)  07/25/2019   SARSCOV2NAA Not Detected 07/12/2019   SARSCOV2NAA POSITIVE (A) 07/01/2019   SARSCOV2NAA Detected (A) 06/11/2019      Discharge diagnosis     Active Problems:   COVID-19 virus infection    Discharge instructions    Discharge Instructions    Diet - low sodium heart healthy   Complete by: As directed    Discharge instructions   Complete by: As directed    Follow with Primary MD in 7 days   Get CBC, CMP, 2 view Chest X ray -  checked next visit within 1 week by Primary MD    Activity: As tolerated with Full fall precautions use walker/cane & assistance as needed  Disposition Home    Diet: Heart Healthy    Special Instructions: If you have smoked or chewed Tobacco  in the last 2 yrs please stop smoking, stop any regular Alcohol  and or any Recreational drug use.  On your next visit with your primary care physician please Get Medicines reviewed and adjusted.  Please request your Prim.MD to go over all Hospital Tests and Procedure/Radiological results at the follow up, please get all Hospital records sent to your Prim MD by signing hospital release before you go home.  If you experience worsening of your admission symptoms, develop  shortness of breath, life threatening emergency, suicidal or homicidal thoughts you must seek medical attention immediately by calling 911 or calling your MD immediately  if symptoms less severe.  You Must read complete instructions/literature along with all the possible adverse reactions/side effects for all the Medicines you take and that have been prescribed to you. Take any new Medicines after you have completely understood and accpet all the possible adverse reactions/side effects.   Do not drive, operate heavy machinery, perform activities at heights, swimming or participation in water activities or provide baby sitting services if your were admitted for syncope or siezures until you have seen by Primary MD or a Neurologist and advised to do so again.   Increase activity slowly   Complete by: As directed       Discharge Medications   Allergies as of 07/26/2019   No Known Allergies     Medication List    STOP taking these medications   acetaminophen 500 MG tablet Commonly known as: TYLENOL   DAYQUIL PO     TAKE these medications   ibuprofen 200 MG tablet Commonly known as: ADVIL Take 400 mg by mouth every 6 (six) hours as needed for fever or headache.   methocarbamol 500 MG tablet Commonly known as: ROBAXIN Take 2 tablets (1,000 mg total) by mouth every 6 (six) hours as needed for muscle spasms.   methylPREDNISolone 4 MG Tbpk tablet Commonly known as: MEDROL DOSEPAK follow package directions       Follow-up Information    Your PCP. Schedule an appointment as soon as possible for a visit in 1 week(s).        [facility]. Schedule an appointment as soon as possible for a visit in 1 week(s).   Why: CMP rechecked Contact information: [address] [place] [id number] [phone]          Major procedures and Radiology Reports - PLEASE review detailed and final reports thoroughly  -       Ct Angio Chest Pe  W And/or Wo Contrast  Result Date: 07/25/2019 CLINICAL DATA:  28 year old male with history of shortness of breath. Fever for the  past 2 days. Evaluate for potential pulmonary embolism. EXAM: CT ANGIOGRAPHY CHEST WITH CONTRAST TECHNIQUE: Multidetector CT imaging of the chest was performed using the standard protocol during bolus administration of intravenous contrast. Multiplanar CT image reconstructions and MIPs were obtained to evaluate the vascular anatomy. CONTRAST:  OMNIPAQUE IOHEXOL 350 MG/ML SOLN COMPARISON:  None. FINDINGS: Cardiovascular: No filling defects are noted within the pulmonary arterial tree to suggest underlying pulmonary embolism. Heart size is normal. There is no significant pericardial fluid, thickening or pericardial calcification. No atherosclerotic calcifications in the thoracic aorta or the coronary arteries. Mediastinum/Nodes: No pathologically enlarged mediastinal or hilar lymph nodes. Esophagus is unremarkable in appearance. No axillary lymphadenopathy. Lungs/Pleura: No acute consolidative airspace disease. No pleural effusions. No suspicious appearing pulmonary nodules or masses are noted. Upper Abdomen: Unremarkable. Musculoskeletal: There are no aggressive appearing lytic or blastic lesions noted in the visualized portions of the skeleton. Review of the MIP images confirms the above findings. IMPRESSION: 1. No evidence of pulmonary embolism. 2. No acute findings in the thorax to account for the patient's symptoms. Electronically Signed   By: Trudie Reed M.D.   On: 07/25/2019 10:40   Dg Chest Portable 1 View  Result Date: 07/25/2019 CLINICAL DATA:  Fever and headache.  History of COVID-19 positive EXAM: PORTABLE CHEST 1 VIEW COMPARISON:  July 01, 2019. FINDINGS: Lungs are clear. The heart size and pulmonary vascularity are normal. No adenopathy. No bone lesions. IMPRESSION: No edema or consolidation.  Stable cardiac silhouette. Electronically Signed   By: Bretta Bang III M.D.   On: 07/25/2019 08:12   Dg Chest Port 1 View  Result Date: 07/01/2019 CLINICAL DATA:  PT reports he has a HA and he tested positive for COVID on 06-11-2019. Pt states HA continues and is not better. Pt has had body chill since COVID positive exam. Pt feels nausea at bedside today. No chest pain. EXAM: PORTABLE CHEST 1 VIEW COMPARISON:  Chest radiograph 06/17/2019 FINDINGS: Stable cardiomediastinal contours. Low volume study with bronchovascular crowding. No focal infiltrate. No pneumothorax or large pleural effusion. No acute abnormality in the visualized osseous structures. IMPRESSION: No evidence of active disease. Electronically Signed   By: Emmaline Kluver M.D.   On: 07/01/2019 17:17    Micro Results     Recent Results (from the past 240 hour(s))  Group A Strep by PCR     Status: None   Collection Time: 07/25/19  8:17 AM   Specimen: Throat; Sterile Swab  Result Value Ref Range Status   Group A Strep by PCR NOT DETECTED NOT DETECTED Final    Comment: Performed at [facility], [address] [place], [place] [address]  Respiratory Panel by PCR     Status: None   Collection Time: 07/25/19  8:17 AM   Specimen: Nasopharyngeal Swab; Respiratory  Result Value Ref Range Status   Adenovirus NOT DETECTED NOT DETECTED Final   Coronavirus 229E NOT DETECTED NOT DETECTED Final    Comment: (NOTE) The Coronavirus on the Respiratory Panel, DOES NOT test for the novel  Coronavirus (2019 nCoV)    Coronavirus HKU1 NOT DETECTED NOT DETECTED Final   Coronavirus NL63 NOT DETECTED NOT DETECTED Final   Coronavirus OC43 NOT DETECTED NOT DETECTED Final   Metapneumovirus NOT DETECTED NOT DETECTED Final   Rhinovirus / Enterovirus NOT DETECTED NOT DETECTED Final   Influenza A NOT DETECTED NOT DETECTED Final   Influenza B NOT DETECTED NOT DETECTED Final   Parainfluenza Virus 1 NOT DETECTED  NOT DETECTED Final   Parainfluenza Virus 2 NOT DETECTED NOT DETECTED Final   Parainfluenza  Virus 3 NOT DETECTED NOT DETECTED Final   Parainfluenza Virus 4 NOT DETECTED NOT DETECTED Final   Respiratory Syncytial Virus NOT DETECTED NOT DETECTED Final   Bordetella pertussis NOT DETECTED NOT DETECTED Final   Chlamydophila pneumoniae NOT DETECTED NOT DETECTED Final   Mycoplasma pneumoniae NOT DETECTED NOT DETECTED Final    Comment: Performed at [facility], [address], [place], [place] [address]  SARS Coronavirus 2 Procedure [facility] order, Performed in [facility] hospital lab) Nasopharyngeal Nasopharyngeal Swab     Status: Abnormal   Collection Time: 07/25/19  9:51 AM   Specimen: Nasopharyngeal Swab  Result Value Ref Range Status   SARS Coronavirus 2 POSITIVE (A) NEGATIVE Final    Comment: RESULT CALLED TO, READ BACK BY AND VERIFIED WITH: Inetta Fermo RN 11:20 07/25/19 ([email or web address]) (NOTE) If result is NEGATIVE SARS-CoV-2 target nucleic acids are NOT DETECTED. The SARS-CoV-2 RNA is generally detectable in upper and lower  respiratory specimens during the acute phase of infection. The lowest  concentration of SARS-CoV-2 viral copies this assay can detect is 250  copies / mL. A negative result does not preclude SARS-CoV-2 infection  and should not be used as the sole basis for treatment or other  patient management decisions.  A negative result may occur with  improper specimen collection / handling, submission of specimen other  than nasopharyngeal swab, presence of viral mutation(s) within the  areas targeted by this assay, and inadequate number of viral copies  (<250 copies / mL). A negative result must be combined with clinical  observations, patient history, and epidemiological information. If result is POSITIVE SARS-CoV-2 target nucleic acids are DETECTED.  The SARS-CoV-2 RNA is generally detectable in upper and lower  respiratory specimens during the acute phase of infection.  Positive  results are indicative of active infection with SARS-CoV-2.  Clinical  correlation with  patient history and other diagnostic information is  necessary to determine patient infection status.  Positive results do  not rule out bacterial infection or co-infection with other viruses. If result is PRESUMPTIVE POSTIVE SARS-CoV-2 nucleic acids MAY BE PRESENT.   A presumptive positive result was obtained on the submitted specimen  and confirmed on repeat testing.  While 2019 novel coronavirus  (SARS-CoV-2) nucleic acids may be present in the submitted sample  additional confirmatory testing may be necessary for epidemiological  and / or clinical management purposes  to differentiate between  SARS-CoV-2 and other Sarbecovirus currently known to infect humans.  If clinically indicated additional testing with an alternate test  methodology [phone])  is advised. The SARS-CoV-2 RNA is generally  detectable in upper and lower respiratory specimens during the acute  phase of infection. The expected result is Negative. Fact Sheet for Patients:  BoilerBrush.com.cy Fact Sheet for Healthcare Providers: https://pope.com/ This test is not yet approved or cleared by the [place] FDA and has been authorized for detection and/or diagnosis of SARS-CoV-2 by FDA under an Emergency Use Authorization (EUA).  This EUA will remain in effect (meaning this test can be used) for the duration of the COVID-19 declaration under Section 564(b)(1) of the Act, 21 U.S.C. section 360bbb-3(b)(1), unless the authorization is terminated or revoked sooner. Performed at [facility], [address], [place], [place] [address]     Today   Subjective    Kelcey Wickstrom today has no headache,no chest abdominal pain,no new weakness tingling or  numbness, feels much better wants to go home today.    Objective   Blood pressure 114/71, pulse 69, temperature 97.9 F (36.6 C), temperature source Oral, resp. rate 18, height 5\' 3"  (1.6 m), weight 63.5 kg, SpO2 96  %.   Intake/Output Summary (Last 24 hours) at 07/26/2019 0918 Last data filed at 07/26/2019 0741 Gross per 24 hour  Intake 2320.76 ml  Output 2600 ml  Net -279.24 ml    Exam Awake Alert, Oriented x 3, No new F.N deficits, Normal affect [place].AT,PERRAL Supple Neck,No JVD, No cervical lymphadenopathy appriciated.  Symmetrical Chest wall movement, Good air movement bilaterally, CTAB RRR,No Gallops,Rubs or new Murmurs, No Parasternal Heave +ve B.Sounds, Abd Soft, Non tender, No organomegaly appriciated, No rebound -guarding or rigidity. No Cyanosis, Clubbing or edema, No new Rash or bruise   Data Review   CBC w Diff:  Lab Results  Component Value Date   WBC 8.8 07/26/2019   HGB 12.3 (L) 07/26/2019   HGB 14.3 07/17/2019   HCT 38.0 (L) 07/26/2019   HCT 44.5 07/17/2019   PLT 342 07/26/2019   PLT 383 07/17/2019   LYMPHOPCT 18 07/26/2019   MONOPCT 7 07/26/2019   EOSPCT 0 07/26/2019   BASOPCT 0 07/26/2019    CMP:  Lab Results  Component Value Date   NA 138 07/26/2019   NA 142 07/17/2019   K 4.0 07/26/2019   CL 102 07/26/2019   CO2 26 07/26/2019   BUN 11 07/26/2019   BUN 10 07/17/2019   CREATININE 0.65 07/26/2019   PROT 7.5 07/26/2019   PROT 8.0 07/17/2019   ALBUMIN 3.8 07/26/2019   ALBUMIN 4.8 07/17/2019   BILITOT 0.3 07/26/2019   BILITOT 0.7 07/17/2019   ALKPHOS 62 07/26/2019   AST 55 (H) 07/26/2019   ALT 168 (H) 07/26/2019  .   Total Time in preparing paper work, data evaluation and todays exam - 32 minutes  Lala Lund M.D on 07/26/2019 at [place]  [phone]

## 2019-07-26 NOTE — Evaluation (Signed)
Physical Therapy Evaluation Patient Details Name: Sevon Rotert MRN: [id number] DOB: 24-Feb-1991 Today's Date: 07/26/2019   History of Present Illness  Jonel Weldon is a 28 y.o. male with without prior significant medical history significant of presenting with persistent symptoms of COVID-19 infection.  Patient was + for COVID-19 infection on 8/17, 8/23 and 9/6.  He has continued to have ongoing symptoms.  He reports prior symptoms of headache, fever, fatigue  Clinical Impression  The patient had complained of dizziness earlier when checked on. Patient getting IV fluids so returned. Patient reports still some dizziness. Patient ambulated x 400' independently. No further needs.    Follow Up Recommendations No PT follow up    Equipment Recommendations  None recommended by PT    Recommendations for Other Services       Precautions / Restrictions Precautions Precaution Comments: reports dizziness      Mobility  Bed Mobility Overal bed mobility: Independent                Transfers Overall transfer level: Independent                  Ambulation/Gait Ambulation/Gait assistance: Independent Gait Distance (Feet): 400 Feet Assistive device: None Gait Pattern/deviations: WFL(Within Functional Limits)   Gait velocity interpretation: >2.62 ft/sec, indicative of community ambulatory    Stairs            Wheelchair Mobility    Modified Rankin (Stroke Patients Only)       Balance                                             Pertinent Vitals/Pain Pain Assessment: Faces Faces Pain Scale: Hurts little more Pain Location: head Pain Descriptors / Indicators: Aching Pain Intervention(s): Monitored during session    Home Living Family/patient expects to be discharged to:: Private residence Living Arrangements: Spouse/significant other;Children Available Help at Discharge: Family Type of Home: House       Home Layout: One level Home Equipment:  None      Prior Function Level of Independence: Independent               Hand Dominance        Extremity/Trunk Assessment   Upper Extremity Assessment Upper Extremity Assessment: Overall WFL for tasks assessed    Lower Extremity Assessment Lower Extremity Assessment: Overall WFL for tasks assessed    Cervical / Trunk Assessment Cervical / Trunk Assessment: Normal  Communication   Communication: No difficulties  Cognition Arousal/Alertness: Awake/alert Behavior During Therapy: WFL for tasks assessed/performed Overall Cognitive Status: Within Functional Limits for tasks assessed                                        General Comments      Exercises     Assessment/Plan    PT Assessment Patent does not need any further PT services  PT Problem List         PT Treatment Interventions      PT Goals (Current goals can be found in the Care Plan section)  Acute Rehab PT Goals Patient Stated Goal: go home PT Goal Formulation: All assessment and education complete, DC therapy    Frequency     Barriers to discharge  Co-evaluation               AM-PAC PT "6 Clicks" Mobility  Outcome Measure Help needed turning from your back to your side while in a flat bed without using bedrails?: None Help needed moving from lying on your back to sitting on the side of a flat bed without using bedrails?: None Help needed moving to and from a bed to a chair (including a wheelchair)?: None Help needed standing up from a chair using your arms (e.g., wheelchair or bedside chair)?: None Help needed to walk in hospital room?: None Help needed climbing 3-5 steps with a railing? : None 6 Click Score: 24    End of Session   Activity Tolerance: Patient tolerated treatment well Patient left: in bed Nurse Communication: Mobility status PT Visit Diagnosis: Muscle weakness (generalized) (M62.81)    Time: 1610-9604 PT Time Calculation (min) (ACUTE  ONLY): 15 min   Charges:   PT Evaluation $PT Eval Low Complexity: 1 Low            Rada Hay 07/26/2019, 2:59 PM

## 2019-08-01 ENCOUNTER — Ambulatory Visit: Payer: Self-pay | Admitting: Family Medicine

## 2019-08-06 ENCOUNTER — Other Ambulatory Visit: Payer: Self-pay | Admitting: *Deleted

## 2019-08-06 DIAGNOSIS — Z20822 Contact with and (suspected) exposure to covid-19: Secondary | ICD-10-CM

## 2019-08-07 LAB — NOVEL CORONAVIRUS, NAA: SARS-CoV-2, NAA: NOT DETECTED

## 2019-08-10 ENCOUNTER — Encounter: Payer: Self-pay | Admitting: Family Medicine

## 2019-08-10 ENCOUNTER — Ambulatory Visit: Payer: No Typology Code available for payment source | Attending: Family Medicine | Admitting: Family Medicine

## 2019-08-10 ENCOUNTER — Other Ambulatory Visit: Payer: Self-pay

## 2019-08-10 VITALS — Temp 96.6°F

## 2019-08-10 DIAGNOSIS — R739 Hyperglycemia, unspecified: Secondary | ICD-10-CM

## 2019-08-10 DIAGNOSIS — Z8619 Personal history of other infectious and parasitic diseases: Secondary | ICD-10-CM | POA: Diagnosis not present

## 2019-08-10 DIAGNOSIS — G4452 New daily persistent headache (NDPH): Secondary | ICD-10-CM

## 2019-08-10 DIAGNOSIS — R7989 Other specified abnormal findings of blood chemistry: Secondary | ICD-10-CM

## 2019-08-10 DIAGNOSIS — Z8616 Personal history of COVID-19: Secondary | ICD-10-CM

## 2019-08-10 NOTE — Progress Notes (Signed)
Taking Ibuprofen only Does not take Tylenol b/c he was told his liver is impaired.  Afebrile  Pain 7/10  Headaches and fever

## 2019-08-10 NOTE — Progress Notes (Signed)
Virtual Visit via Telephone Note  I connected with Jeffrey Jensen on 08/10/19 at 10:30 AM EDT by telephone and verified that I am speaking with the correct person using two identifiers.   I discussed the limitations, risks, security and privacy concerns of performing an evaluation and management service by telephone and the availability of in person appointments. I also discussed with the patient that there may be a patient responsible charge related to this service. The patient expressed understanding and agreed to proceed.  Patient Location: Home Provider Location: [facility] Others participating in call: none   History of Present Illness:         27 year old male who is status post ED visit on 07/01/2019 due to headaches and persistent chills after testing positive for COVID-19 on 06/11/2019.  Testing at the emergency department on 07/01/2019 was also positive for COVID-19.  He has had telemedicine visits with another provider at this office on 07/04/2019 and 07/12/2019 due to complaints of continued fatigue and recurrent headaches.  He is now status post overnight hospitalization from 07/25/2019 through 07/26/2019 after presenting to the ED with continued symptoms of headache and fatigue along with new onset of sore throat and fever the day prior to this visit.  He had negative test for strep but was again positive for COVID-19 after having a negative test on 07/12/2019 he was found to be mildly dehydrated and received IV hydration and 23-hour observation.  He had was after treatment with IV fluids and was discharged home.  His lab work showed continued issues with transaminitis/elevated LFTs which had been present since his initial ED visits and diagnosis of COVID-19.  He was discharged on steroids.  At today's visit however he continues to complain of recurrent headaches.  Headaches are dull and generalized and headaches occur on a daily basis.  He was prescribed baclofen at prior visits but he states that this  has not really helped with his headaches.  He does get some temporary relief/decrease in headaches with the use of Tylenol.  He denies any dizziness or loss of balance.  No changes in vision.  He denies any issues with abdominal pain-no nausea/vomiting/diarrhea or constipation.  No change in stool color.  No blood in the stool or black stools.  No urinary frequency, urgency or dysuria.  He denies any shortness of breath or cough.  He continues to have fatigue.  He denies chest pain or palpitations.  Past Medical History:  Diagnosis Date  . COVID-19     Past Surgical History:  Procedure Laterality Date  . NO PAST SURGERIES      Family History  Problem Relation Age of Onset  . Diabetes Father   . Cancer Neg Hx   . Hypertension Neg Hx   . Heart disease Neg Hx     Social History   Tobacco Use  . Smoking status: Never Smoker  . Smokeless tobacco: Never Used  Substance Use Topics  . Alcohol use: No  . Drug use: No     No Known Allergies     Observations/Objective: No vital signs or physical exam conducted as visit was done via telephone  Assessment and Plan: 1. New persistent daily headache Reports status post recent hospitalization for COVID-19 but he actually has been having headaches off and on since August when he initially was diagnosed with COVID-19 infection.  He is to continue to remain well-hydrated, take Tylenol as needed for headaches and referral will be made for patient to follow-up  with neurology regarding his headaches. - Ambulatory referral to Neurology  2. Elevated LFTs; 3.  History of 2019 novel coronavirus disease  (Covid-19 ) He has been asked to schedule appointment for repeat LFTs in follow-up of elevated liver enzymes during his recent hospitalization and he has had recent increased elevated LFTs since initial Covid 19 infection.  Most recent blood work from 07/26/2019 showed AST of 55 and ALT of 168.  Order placed for patient to have lab visit in approximately  2 weeks for repeat comprehensive metabolic panel. - Comprehensive metabolic panel; Future  4. Elevated blood sugar level Patient with glucose of 143 on comprehensive metabolic panel done on 07/26/2019.  This may have been related to treatment with prednisone.  Patient is to have upcoming lab visit for comprehensive metabolic panel and hemoglobin O9G to make sure that blood sugars are now back to normal. - Comprehensive metabolic panel; Future - Hemoglobin A1c; Future  Follow Up Instructions:Return in about 4 weeks (around 09/07/2019) for Headache/elevated LFTs; blood work in 2 to 3 weeks.    I discussed the assessment and treatment plan with the patient. The patient was provided an opportunity to ask questions and all were answered. The patient agreed with the plan and demonstrated an understanding of the instructions.   The patient was advised to call back or seek an in-person evaluation if the symptoms worsen or if the condition fails to improve as anticipated.  I provided 12  minutes of non-face-to-face time during this encounter.   Cain Saupe, MD

## 2019-09-06 ENCOUNTER — Telehealth: Payer: No Typology Code available for payment source | Admitting: Neurology

## 2020-01-14 ENCOUNTER — Encounter: Payer: Self-pay | Admitting: Family Medicine

## 2020-03-01 IMAGING — CT CT ANGIO CHEST
2 of 6 series · 19 of 36 positions shown · IV contrast (omnipaque)
Comparison: None.

CLINICAL DATA: 28-year-old male with history of shortness of
breath. Fever for the past 2 days. Evaluate for potential pulmonary
embolism.

EXAM:
CT ANGIOGRAPHY CHEST WITH CONTRAST
TECHNIQUE: Multidetector CT imaging of the chest was performed using the
standard protocol during bolus administration of intravenous
contrast. Multiplanar CT image reconstructions and MIPs were
obtained to evaluate the vascular anatomy.
CONTRAST:  100mL OMNIPAQUE IOHEXOL 350 MG/ML SOLN

[Series 7: pe thins · axial · 0.79mm/px · z∈[+1168,+1382]mm · 18 of 341 slices shown]
[im 18/341  lung]
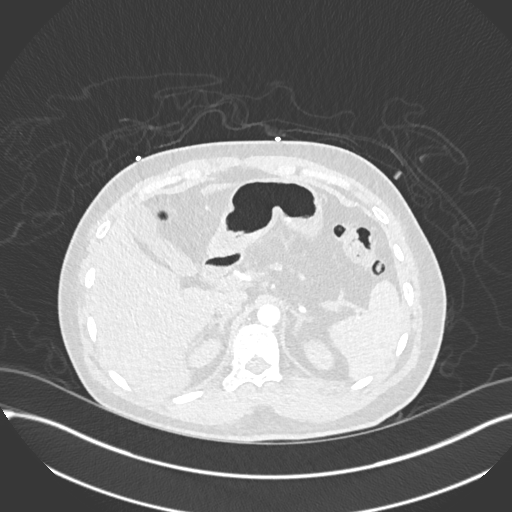
[im 35/341  mediastinal]
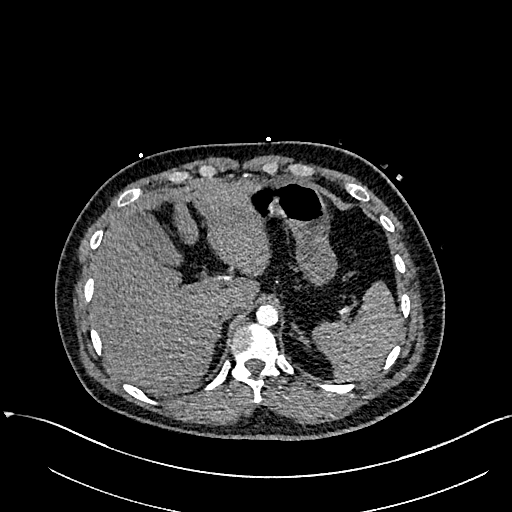
[im 52/341  lung]
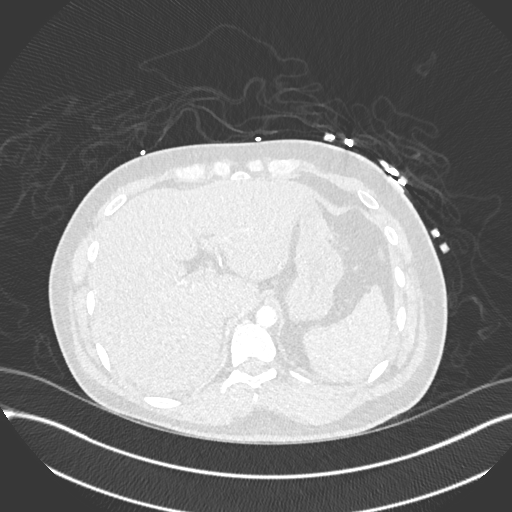
[im 69/341  mediastinal]
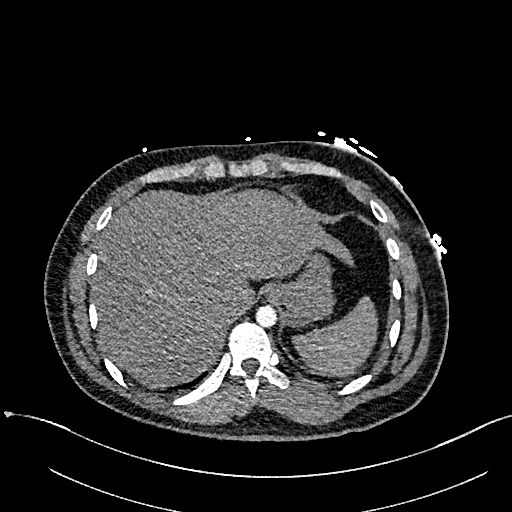
[im 86/341  lung]
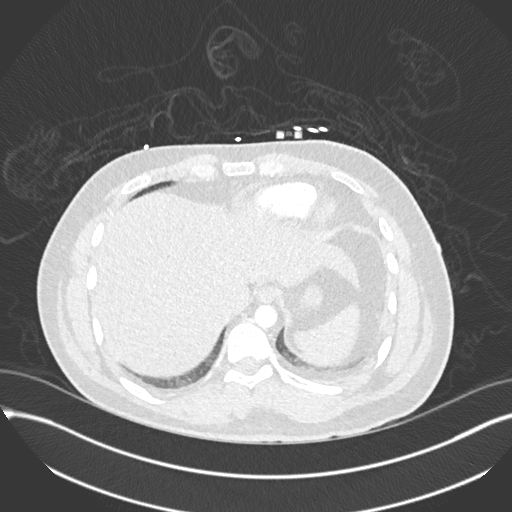
[im 103/341  mediastinal]
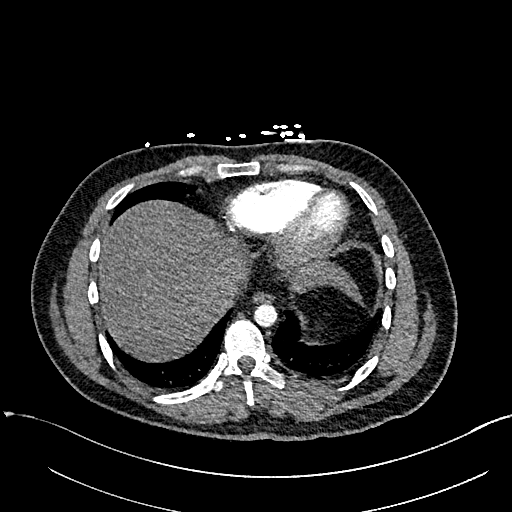
[im 120/341  lung]
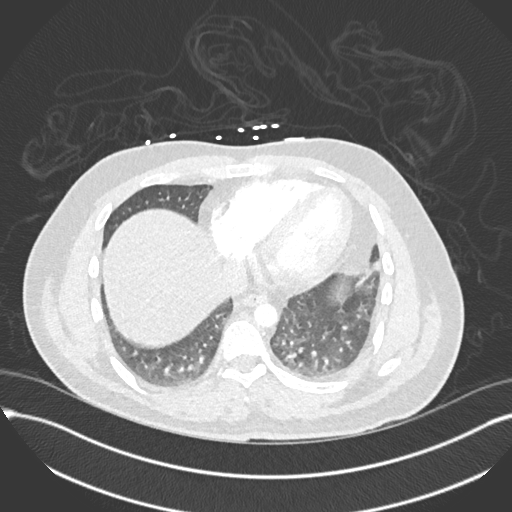
[im 137/341  mediastinal]
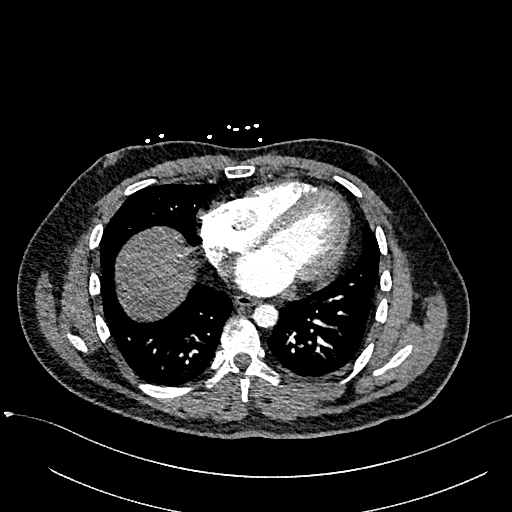
[im 154/341  lung]
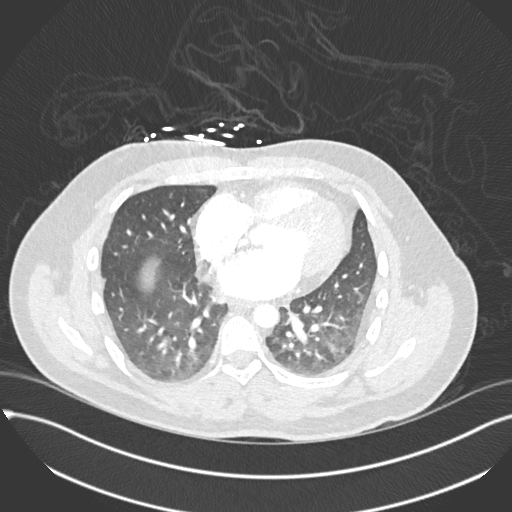
[im 188/341  mediastinal]
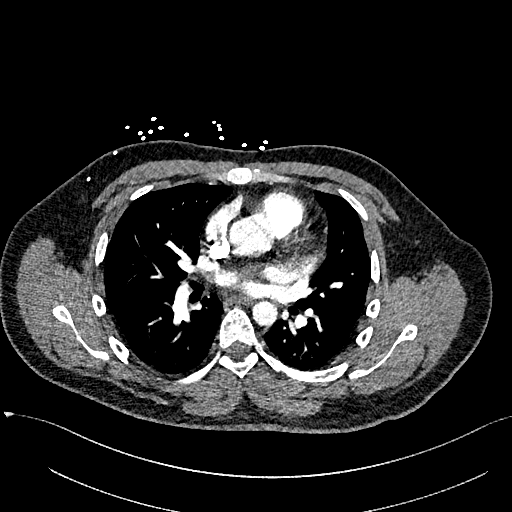
[im 205/341  lung]
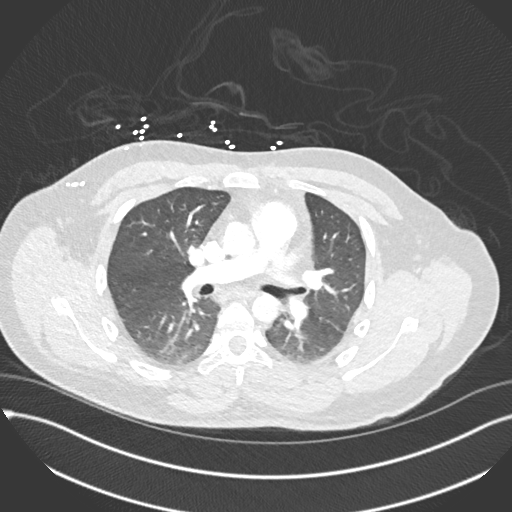
[im 222/341  mediastinal]
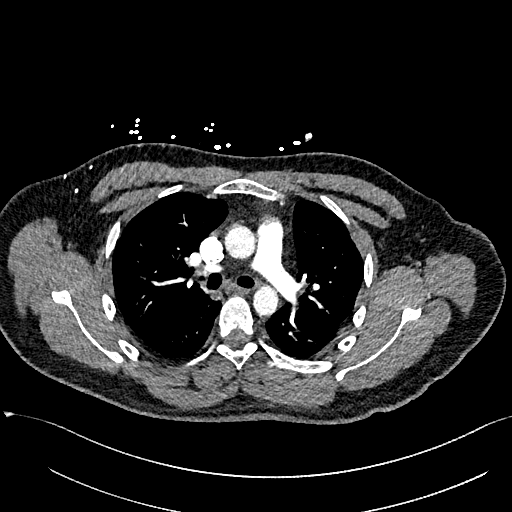
[im 239/341  lung]
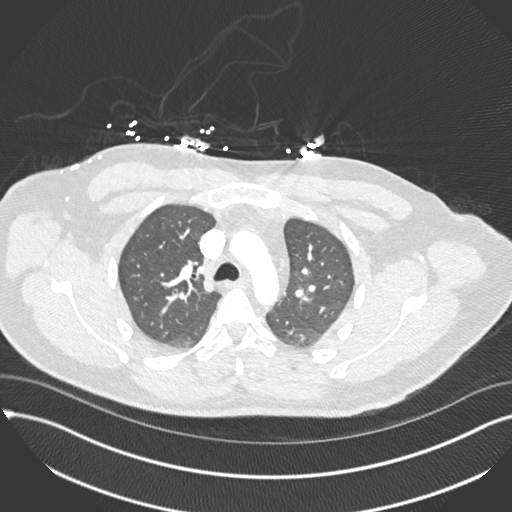
[im 256/341  mediastinal]
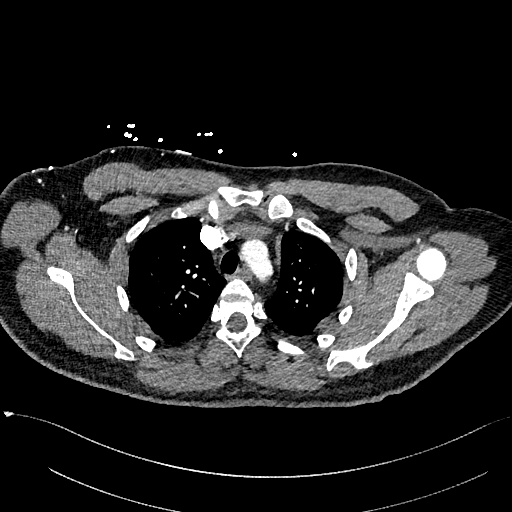
[im 273/341  lung]
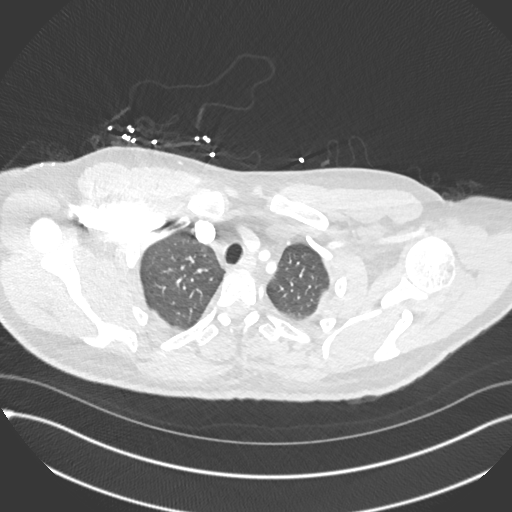
[im 290/341  mediastinal]
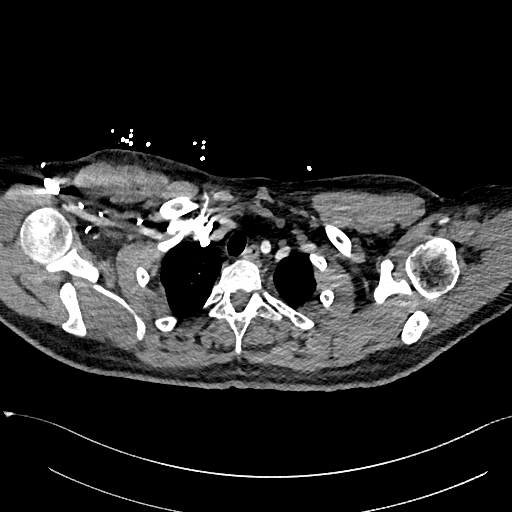
[im 307/341  lung]
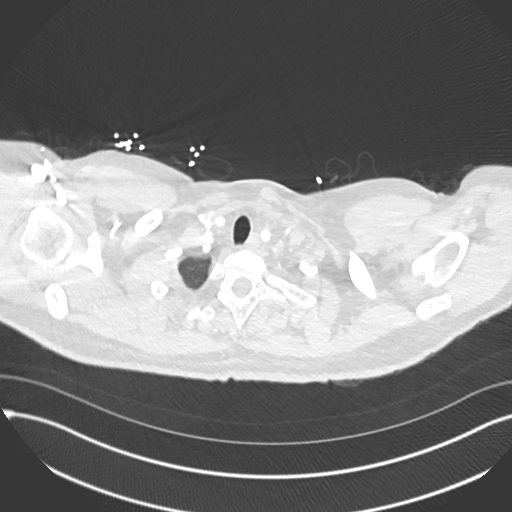
[im 324/341  mediastinal]
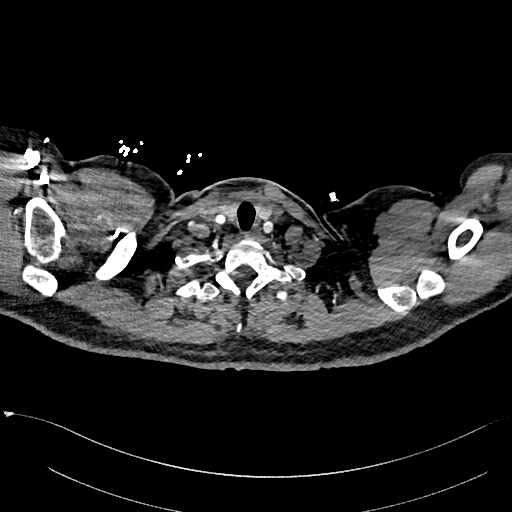

[Series 8: pe 2mm cor · coronal · 0.51mm/px · 1 of 107 slices shown]
[im 54/107  mediastinal]
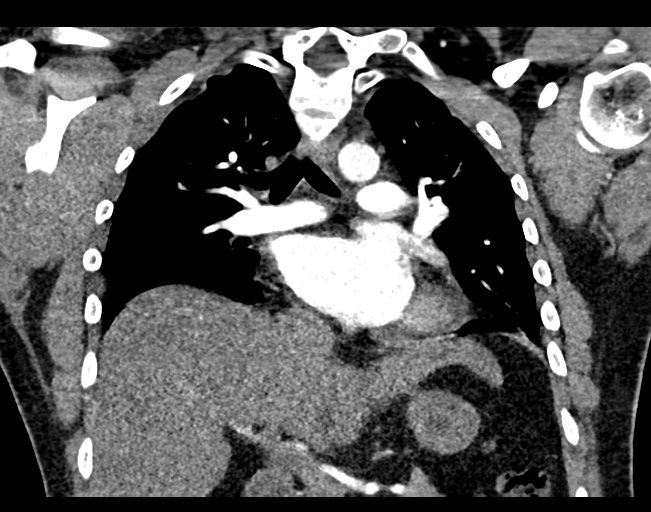

[19 of 36 positions shown; findings below may reference images not displayed]

FINDINGS: Cardiovascular: No filling defects are noted within the pulmonary
arterial tree to suggest underlying pulmonary embolism. Heart size
is normal. There is no significant pericardial fluid, thickening or
pericardial calcification. No atherosclerotic calcifications in the
thoracic aorta or the coronary arteries.

Mediastinum/Nodes: No pathologically enlarged mediastinal or hilar
lymph nodes. Esophagus is unremarkable in appearance. No axillary
lymphadenopathy.

Lungs/Pleura: No acute consolidative airspace disease. No pleural
effusions. No suspicious appearing pulmonary nodules or masses are
noted.

Upper Abdomen: Unremarkable.

Musculoskeletal: There are no aggressive appearing lytic or blastic
lesions noted in the visualized portions of the skeleton.

Review of the MIP images confirms the above findings.
IMPRESSION: 1. No evidence of pulmonary embolism.
2. No acute findings in the thorax to account for the patient's
symptoms.

## 2020-03-01 IMAGING — DX DG CHEST 1V PORT
1 series · 1 of 1 positions shown · non-contrast
Comparison: July 01, 2019.

CLINICAL DATA: Fever and headache.  History of UHFQ4-SS positive

EXAM:
PORTABLE CHEST 1 VIEW

[chest ap]
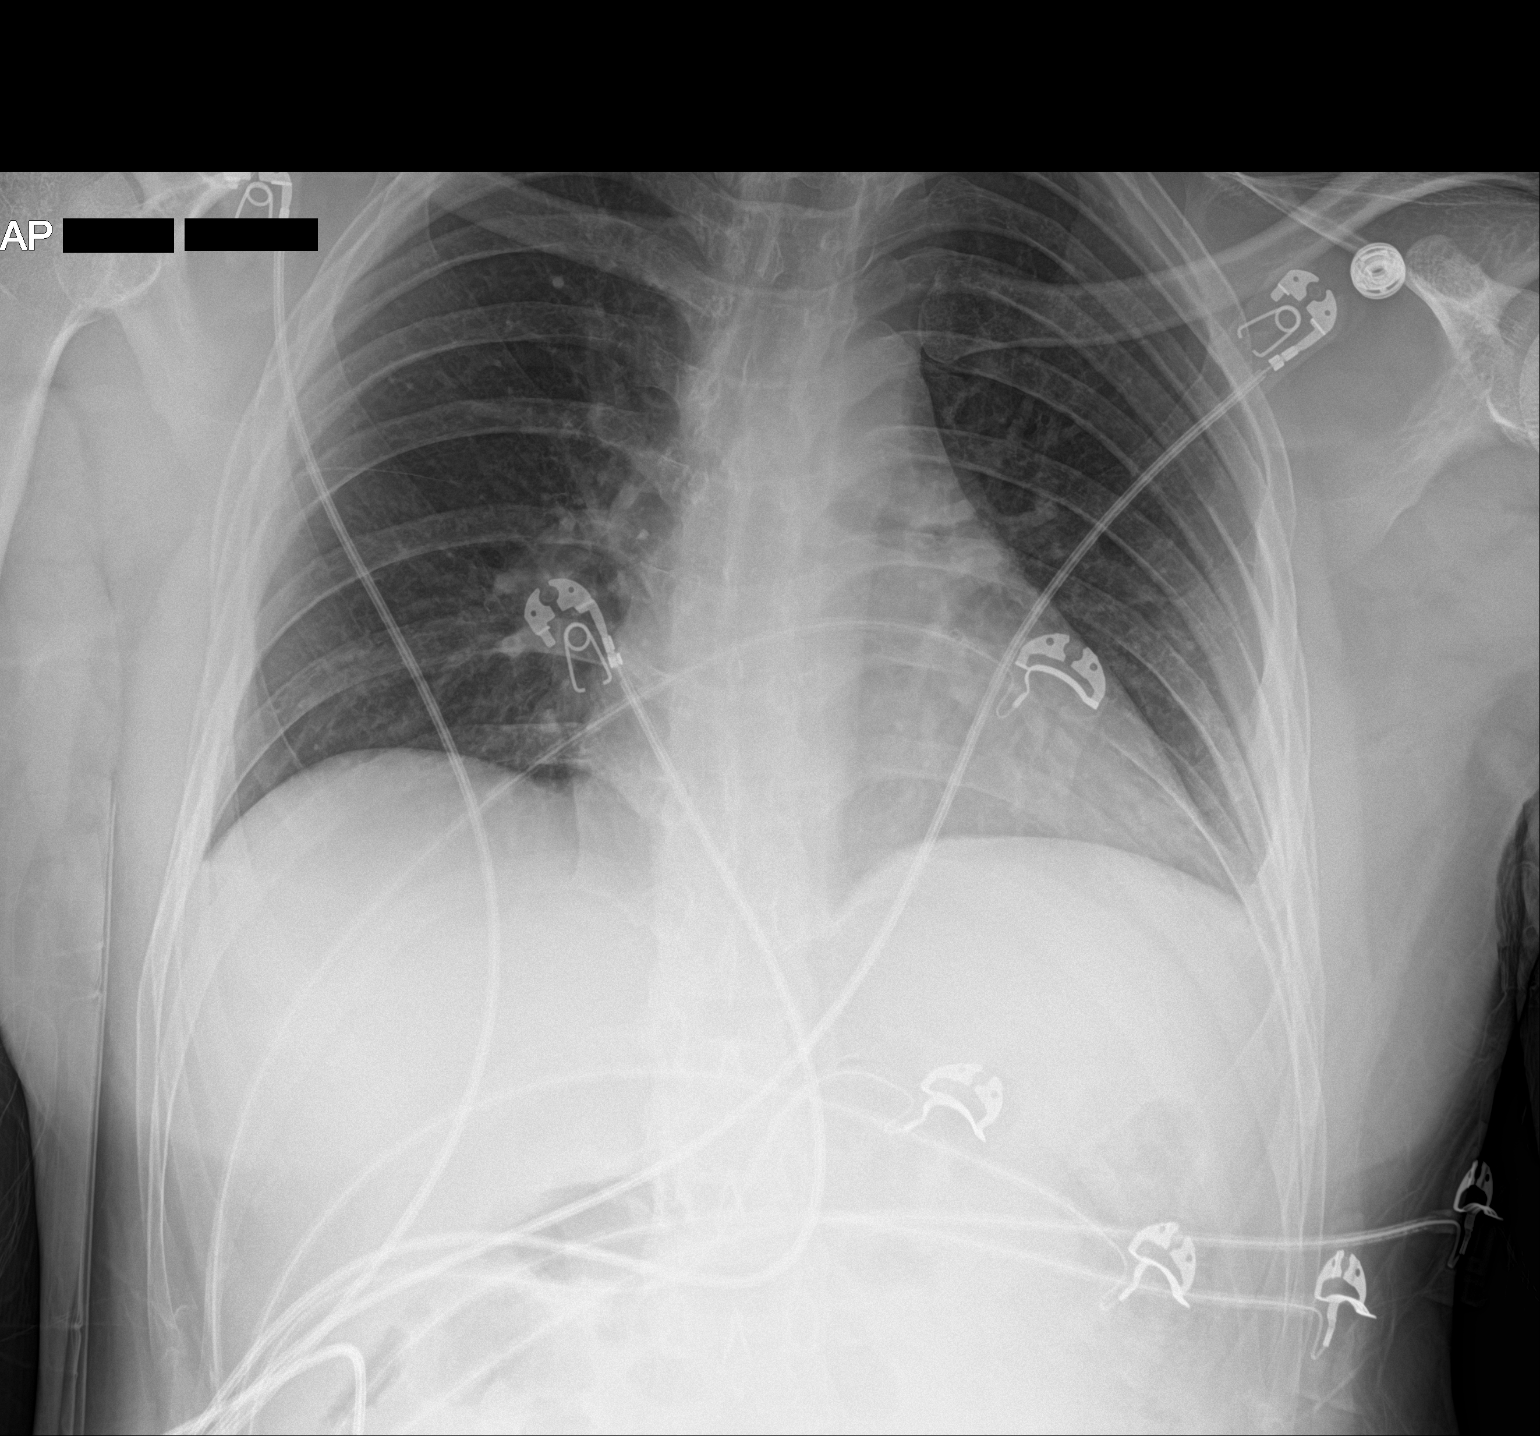

[1 of 1 positions shown; findings below may reference images not displayed]

FINDINGS: Lungs are clear. The heart size and pulmonary vascularity are
normal. No adenopathy. No bone lesions.
IMPRESSION: No edema or consolidation.  Stable cardiac silhouette.

## 2022-02-20 ENCOUNTER — Emergency Department (HOSPITAL_COMMUNITY)
Admission: EM | Admit: 2022-02-20 | Discharge: 2022-02-20 | Disposition: A | Discharge status: Home / Self Care | Payer: BC Managed Care – PPO | Attending: Emergency Medicine | Admitting: Emergency Medicine

## 2022-02-20 ENCOUNTER — Emergency Department (HOSPITAL_COMMUNITY): Payer: BC Managed Care – PPO

## 2022-02-20 ENCOUNTER — Other Ambulatory Visit: Payer: Self-pay

## 2022-02-20 ENCOUNTER — Encounter (HOSPITAL_COMMUNITY): Payer: Self-pay | Admitting: Emergency Medicine

## 2022-02-20 DIAGNOSIS — J020 Streptococcal pharyngitis: Secondary | ICD-10-CM | POA: Diagnosis not present

## 2022-02-20 DIAGNOSIS — R000 Tachycardia, unspecified: Secondary | ICD-10-CM | POA: Diagnosis not present

## 2022-02-20 DIAGNOSIS — G4700 Insomnia, unspecified: Secondary | ICD-10-CM | POA: Diagnosis not present

## 2022-02-20 DIAGNOSIS — J039 Acute tonsillitis, unspecified: Secondary | ICD-10-CM | POA: Diagnosis not present

## 2022-02-20 DIAGNOSIS — R519 Headache, unspecified: Secondary | ICD-10-CM | POA: Diagnosis present

## 2022-02-20 DIAGNOSIS — Z20822 Contact with and (suspected) exposure to covid-19: Secondary | ICD-10-CM | POA: Insufficient documentation

## 2022-02-20 LAB — CBC WITH DIFFERENTIAL/PLATELET
Abs Immature Granulocytes: 0.03 10*3/uL (ref 0.00–0.07)
Basophils Absolute: 0 10*3/uL (ref 0.0–0.1)
Basophils Relative: 0 %
Eosinophils Absolute: 0 10*3/uL (ref 0.0–0.5)
Eosinophils Relative: 0 %
HCT: 36.9 % — ABNORMAL LOW (ref 39.0–52.0)
Hemoglobin: 12.4 g/dL — ABNORMAL LOW (ref 13.0–17.0)
Immature Granulocytes: 0 %
Lymphocytes Relative: 20 %
Lymphs Abs: 2 10*3/uL (ref 0.7–4.0)
MCH: 26.6 pg (ref 26.0–34.0)
MCHC: 33.6 g/dL (ref 30.0–36.0)
MCV: 79 fL — ABNORMAL LOW (ref 80.0–100.0)
Monocytes Absolute: 1 10*3/uL (ref 0.1–1.0)
Monocytes Relative: 9 %
Neutro Abs: 7.1 10*3/uL (ref 1.7–7.7)
Neutrophils Relative %: 71 %
Platelets: 266 10*3/uL (ref 150–400)
RBC: 4.67 MIL/uL (ref 4.22–5.81)
RDW: 13.6 % (ref 11.5–15.5)
WBC: 10.1 10*3/uL (ref 4.0–10.5)
nRBC: 0 % (ref 0.0–0.2)

## 2022-02-20 LAB — RESP PANEL BY RT-PCR (FLU A&B, COVID) ARPGX2
Influenza A by PCR: NEGATIVE
Influenza B by PCR: NEGATIVE
SARS Coronavirus 2 by RT PCR: NEGATIVE

## 2022-02-20 LAB — COMPREHENSIVE METABOLIC PANEL
ALT: 227 U/L — ABNORMAL HIGH (ref 0–44)
AST: 125 U/L — ABNORMAL HIGH (ref 15–41)
Albumin: 3.2 g/dL — ABNORMAL LOW (ref 3.5–5.0)
Alkaline Phosphatase: 84 U/L (ref 38–126)
Anion gap: 9 (ref 5–15)
BUN: 7 mg/dL (ref 6–20)
CO2: 25 mmol/L (ref 22–32)
Calcium: 8.8 mg/dL — ABNORMAL LOW (ref 8.9–10.3)
Chloride: 101 mmol/L (ref 98–111)
Creatinine, Ser: 0.71 mg/dL (ref 0.61–1.24)
GFR, Estimated: >60 mL/min (ref >60)
Glucose, Bld: 99 mg/dL (ref 70–99)
Potassium: 3.7 mmol/L (ref 3.5–5.1)
Sodium: 135 mmol/L (ref 135–145)
Total Bilirubin: 0.7 mg/dL (ref 0.3–1.2)
Total Protein: 7 g/dL (ref 6.5–8.1)

## 2022-02-20 MED ORDER — CLINDAMYCIN HCL 300 MG PO CAPS: 300.0000 mg | ORAL_CAPSULE | Freq: Three times a day (TID) | ORAL | 0 refills | Status: AC

## 2022-02-20 MED ORDER — LIDOCAINE VISCOUS HCL 2 % MT SOLN: 15.0000 mL | Freq: Four times a day (QID) | OROMUCOSAL | 0 refills | Status: AC | PRN

## 2022-02-20 MED ORDER — DEXAMETHASONE SODIUM PHOSPHATE 10 MG/ML IJ SOLN
10.0000 mg | Freq: Once | INTRAMUSCULAR | Status: AC
Start: 2022-02-20 — End: 2022-02-20
  Administered 2022-02-20: 10 mg via INTRAVENOUS
  Filled 2022-02-20: qty 1

## 2022-02-20 MED ORDER — LIDOCAINE VISCOUS HCL 2 % MT SOLN
15.0000 mL | Freq: Once | OROMUCOSAL | Status: AC
  Administered 2022-02-20: 15 mL via OROMUCOSAL
  Filled 2022-02-20: qty 15

## 2022-02-20 MED ORDER — SODIUM CHLORIDE 0.9 % IV SOLN
1.0000 g | Freq: Once | INTRAVENOUS | Status: AC
  Administered 2022-02-20: 1 g via INTRAVENOUS
  Filled 2022-02-20: qty 10

## 2022-02-20 MED ORDER — KETOROLAC TROMETHAMINE 30 MG/ML IJ SOLN
30.0000 mg | Freq: Once | INTRAMUSCULAR | Status: AC
  Administered 2022-02-20: 30 mg via INTRAVENOUS
  Filled 2022-02-20: qty 1

## 2022-02-20 MED ORDER — PREDNISONE 50 MG PO TABS: 50.0000 mg | ORAL_TABLET | Freq: Every day | ORAL | 0 refills | Status: AC

## 2022-02-20 MED ORDER — ACETAMINOPHEN 325 MG PO TABS
650.0000 mg | ORAL_TABLET | Freq: Once | ORAL | Status: AC | PRN
  Administered 2022-02-20: 650 mg via ORAL
  Filled 2022-02-20: qty 2

## 2022-02-20 MED ORDER — MORPHINE SULFATE (PF) 4 MG/ML IV SOLN
4.0000 mg | Freq: Once | INTRAVENOUS | Status: AC
  Administered 2022-02-20: 4 mg via INTRAVENOUS
  Filled 2022-02-20: qty 1

## 2022-02-20 MED ORDER — IOHEXOL 300 MG/ML  SOLN
100.0000 mL | Freq: Once | INTRAMUSCULAR | Status: AC | PRN
  Administered 2022-02-20: 100 mL via INTRAVENOUS

## 2022-02-20 MED ORDER — SODIUM CHLORIDE 0.9 % IV BOLUS
1000.0000 mL | Freq: Once | INTRAVENOUS | Status: AC
  Administered 2022-02-20: 1000 mL via INTRAVENOUS

## 2022-02-20 MED ORDER — HYDROCODONE-ACETAMINOPHEN 5-325 MG PO TABS
1.0000 | ORAL_TABLET | ORAL | 0 refills | Status: AC | PRN
Start: 2022-02-20 — End: ?

## 2022-02-20 NOTE — ED Provider Notes (Signed)
?[facility] EMERGENCY DEPARTMENT ?Provider Note ? ? ?CSN: [id number] ?Arrival date & time: 02/20/22  0263 ? ?  ? ?History ? ?Chief Complaint  ?Patient presents with  ? Headache  ? Insomnia  ? ? ?Jeffrey Jensen is a 31 y.o. male. ? ?Pt is a 31 yo male with no significant pmhx.  He has had a sore throat for 5 days.  He went to [facility] on 4/27.  He was diagnosed with strep and d/c with amox 500 mg bid.  He has been taking that, but he still has a lot of pain and is getting fevers.  He has a headache and he can't sleep.  Pt had a negative Ag Covid test on the 27th as well.  ? ? ?  ? ?Home Medications ?Prior to Admission medications   ?Medication Sig Start Date End Date Taking? Authorizing Provider  ?clindamycin (CLEOCIN) 300 MG capsule Take 1 capsule (300 mg total) by mouth 3 (three) times daily. 02/20/22  Yes Jacalyn Lefevre, MD  ?HYDROcodone-acetaminophen (NORCO/VICODIN) 5-325 MG tablet Take 1 tablet by mouth every 4 (four) hours as needed. 02/20/22  Yes Jacalyn Lefevre, MD  ?lidocaine (XYLOCAINE) 2 % solution Use as directed 15 mLs in the mouth or throat every 6 (six) hours as needed (throat pain). 02/20/22  Yes Jacalyn Lefevre, MD  ?predniSONE (DELTASONE) 50 MG tablet Take 1 tablet (50 mg total) by mouth daily with breakfast. 02/20/22  Yes Jacalyn Lefevre, MD  ?ibuprofen (ADVIL) 200 MG tablet Take 400 mg by mouth every 6 (six) hours as needed for fever or headache.    [provider]  ?methocarbamol (ROBAXIN) 500 MG tablet Take 2 tablets (1,000 mg total) by mouth every 6 (six) hours as needed for muscle spasms. ?Patient not taking: Reported on 08/10/2019 07/04/19   Anders Simmonds, PA-C  ?methylPREDNISolone (MEDROL DOSEPAK) 4 MG TBPK tablet follow package directions ?Patient not taking: Reported on 08/10/2019 07/26/19   Leroy Sea, MD  ?   ? ?Allergies    ?Patient has no known allergies.   ? ?Review of Systems   ?Review of Systems  ?Constitutional:  Positive for fever.  ?HENT:  Positive for sore  throat and trouble swallowing.   ?Neurological:  Positive for headaches.  ?Psychiatric/Behavioral:  Positive for sleep disturbance.   ?All other systems reviewed and are negative. ? ?Physical Exam ?Updated Vital Signs ?BP 109/72 (BP Location: Right Arm)   Pulse 84   Temp 98.6 ?F (37 ?C) (Oral)   Resp 17   SpO2 98%  ?Physical Exam ?Vitals and nursing note reviewed.  ?Constitutional:   ?   Appearance: He is well-developed.  ?HENT:  ?   Head: Normocephalic and atraumatic.  ?   Mouth/Throat:  ?   Mouth: Mucous membranes are dry.  ?   Pharynx: Pharyngeal swelling and posterior oropharyngeal erythema present.  ?   Tonsils: 3+ on the right. 3+ on the left.  ?Eyes:  ?   Extraocular Movements: Extraocular movements intact.  ?   Pupils: Pupils are equal, round, and reactive to light.  ?Neck:  ?   Comments: Pt has several symmetrical bruises to his anterior neck.  These bruises are self-induced from a traditional treatment to help heal.   ?Cardiovascular:  ?   Rate and Rhythm: Regular rhythm. Tachycardia present.  ?   Heart sounds: Normal heart sounds.  ?Pulmonary:  ?   Effort: Pulmonary effort is normal.  ?   Breath sounds: Normal breath sounds.  ?Abdominal:  ?  General: Bowel sounds are normal.  ?   Palpations: Abdomen is soft.  ?Musculoskeletal:     ?   General: Normal range of motion.  ?Lymphadenopathy:  ?   Cervical: Cervical adenopathy present.  ?Skin: ?   General: Skin is warm.  ?Neurological:  ?   Mental Status: He is alert and oriented to person, place, and time.  ?Psychiatric:     ?   Mood and Affect: Mood normal.     ?   Speech: Speech normal.     ?   Behavior: Behavior normal.  ? ? ?ED Results / Procedures / Treatments   ?Labs ?(all labs ordered are listed, but only abnormal results are displayed) ?Labs Reviewed  ?CBC WITH DIFFERENTIAL/PLATELET - Abnormal; Notable for the following components:  ?    Result Value  ? Hemoglobin 12.4 (*)   ? HCT 36.9 (*)   ? MCV 79.0 (*)   ? All other components within normal  limits  ?COMPREHENSIVE METABOLIC PANEL - Abnormal; Notable for the following components:  ? Calcium 8.8 (*)   ? Albumin 3.2 (*)   ? AST 125 (*)   ? ALT 227 (*)   ? All other components within normal limits  ?RESP PANEL BY RT-PCR (FLU A&B, COVID) [id number]  ? ? ?EKG ?None ? ?Radiology ?CT Soft Tissue Neck W Contrast ? ?Result Date: 02/20/2022 ?CLINICAL DATA:  31 year old male with sickness for 5 days, tested positive for Streptococcus. On antibiotics. EXAM: CT NECK WITH CONTRAST TECHNIQUE: Multidetector CT imaging of the neck was performed using the standard protocol following the bolus administration of intravenous contrast. RADIATION DOSE REDUCTION: This exam was performed according to the departmental dose-optimization program which includes automated exposure control, adjustment of the mA and/or kV according to patient size and/or use of iterative reconstruction technique. CONTRAST:  100mL OMNIPAQUE IOHEXOL 300 MG/ML  SOLN COMPARISON:  None. FINDINGS: Pharynx and larynx: Normal layering send epiglottis. Hypopharynx mildly distended with gas. Enlarged bilateral palatine tonsils with a variegated pattern of hyperenhancement (series 3, image 42, series 6, image 47). Associated mild enhancement and swelling of the soft palate. Adenoids relatively spared. Parapharyngeal spaces remain normal and there is no discrete tonsillar abscess. Retropharyngeal spaces normal except for an enlarged and enhancing right retropharyngeal lymph node on series 3, image 44, 10 mm short axis. See other lymph node findings below. Salivary glands: Negative. Thyroid: Negative. Lymph nodes: Retropharyngeal lymphadenopathy on the right described above. Similar reactive appearing bilateral level 2 lymph nodes which are mildly hyperenhancing and individually up to 10 mm short axis. No cystic or necrotic nodes. Vascular: Major vascular structures in the neck and at the skull base are patent including both internal jugular veins. Limited intracranial:  Negative; Diminutive vertebrobasilar system appears to beyond the basis of fetal type bilateral PCA origins. Visualized orbits: Negative. Mastoids and visualized paranasal sinuses: Clear except for minor maxillary sinus mucosal thickening greater on the left. Tympanic cavities and mastoids are clear. Skeleton: No acute dental finding. Previous right mandible posterior molar extraction. No acute osseous abnormality identified. Upper chest: Negative. IMPRESSION: Acute Tonsillitis with reactive retropharyngeal and level 2 lymph nodes. No tonsillar or neck abscess. Electronically Signed   By: Odessa FlemingH  Hall M.D.   On: 02/20/2022 10:24   ? ?Procedures ?Procedures  ? ? ?Medications Ordered in ED ?Medications  ?morphine (PF) 4 MG/ML injection 4 mg (has no administration in time range)  ?acetaminophen (TYLENOL) tablet 650 mg (650 mg Oral Given 02/20/22 0724)  ?sodium chloride 0.9 %  bolus 1,000 mL (0 mLs Intravenous Stopped 02/20/22 1115)  ?ketorolac (TORADOL) 30 MG/ML injection 30 mg (30 mg Intravenous Given 02/20/22 0857)  ?dexamethasone (DECADRON) injection 10 mg (10 mg Intravenous Given 02/20/22 0858)  ?cefTRIAXone (ROCEPHIN) 1 g in sodium chloride 0.9 % 100 mL IVPB (0 g Intravenous Stopped 02/20/22 1115)  ?lidocaine (XYLOCAINE) 2 % viscous mouth solution 15 mL (15 mLs Mouth/Throat Given 02/20/22 0857)  ?iohexol (OMNIPAQUE) 300 MG/ML solution 100 mL (100 mLs Intravenous Contrast Given 02/20/22 1009)  ? ? ?ED Course/ Medical Decision Making/ A&P ?  ?                        ?Medical Decision Making ?Amount and/or Complexity of Data Reviewed ?Labs: ordered. ?Radiology: ordered. ? ?Risk ?OTC drugs. ?Prescription drug management. ? ? ?This patient presents to the ED for concern of fever and sore throat, this involves an extensive number of treatment options, and is a complaint that carries with it a high risk of complications and morbidity.  The differential diagnosis includes strep, covid/flu, tonsillitis, peritonsillar abscess ? ? ?Co  morbidities that complicate the patient evaluation ? ?none ? ? ?Additional history obtained: ? ?Additional history obtained from epic chart review ? ?Lab Tests: ? ?I Ordered, and personally interpreted labs.  Lowella Dandy

## 2022-02-20 NOTE — Discharge Instructions (Addendum)
Stop the Amox and start the clindamycin. ?

## 2022-02-20 NOTE — ED Triage Notes (Signed)
Pt states he has been sick for 5 days.  Seen at [facility] on Thursday-COVID negative and strep positive.  States he is taking antibiotic.  C/o headache and unable to sleep since Thursday. ?

## 2023-01-06 ENCOUNTER — Ambulatory Visit
Admission: RE | Admit: 2023-01-06 | Discharge: 2023-01-06 | Disposition: A | Discharge status: Home / Self Care | Payer: No Typology Code available for payment source | Source: Ambulatory Visit | Attending: Obstetrics and Gynecology | Admitting: Obstetrics and Gynecology

## 2023-01-06 ENCOUNTER — Other Ambulatory Visit: Payer: Self-pay | Admitting: Obstetrics and Gynecology

## 2023-01-06 DIAGNOSIS — Z201 Contact with and (suspected) exposure to tuberculosis: Secondary | ICD-10-CM

## 2024-05-04 ENCOUNTER — Ambulatory Visit: Attending: Obstetrics and Gynecology

## 2024-05-04 DIAGNOSIS — Z8481 Family history of carrier of genetic disease: Secondary | ICD-10-CM

## 2024-05-04 DIAGNOSIS — Z31440 Encounter of male for testing for genetic disease carrier status for procreative management: Secondary | ICD-10-CM

## 2024-05-20 LAB — HORIZON CUSTOM: REPORT SUMMARY: POSITIVE — AB

## 2024-05-21 ENCOUNTER — Telehealth: Payer: Self-pay | Admitting: Obstetrics and Gynecology

## 2024-05-21 NOTE — Telephone Encounter (Signed)
 I spoke with Benjamim  Amory, partner of Yuo Rahlan, to review his carrier screening results for alpha and beta hemoglobinopathies.  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   Codie 's results were normal for beta hemoglobin, so the baby may be a carrier for hemoglobin E, but is not expected to have health concerns related to that condition.     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  Ms. Rahlan will always pass on one normal and one deleted copy of this gene to her children (-?) and Lian  will either pass on two normal copies of the gene or one normal and one with the constant spring variant.  Therefore, there is a 1 in 2 chance for a child to be a compound heterozygote for the Constant Spring variant and the 3.7 deletion (?CS?/-?). People with this condition may have symptoms similar to hemoglobin H disease, including chronic anemia, liver disease, and bone changes. Some affected individuals do not require blood transfusions while others may require occasional blood transfusions throughout their lifetime. The other 50% of their children will be silent alpha thalassemia carriers for the deletion (-?/??) and are not expected to have health concerns related to that change.   Prenatal diagnostic testing is available during pregnancy through amniocentesis if the couple desires additional information prior to birth. Genetic testing after birth is also available. At this time, the couple declined amniocentesis.   We can be reached at [phone].   Barnie PHEBE Dixons, MS, CGC

## 2024-08-30 NOTE — Progress Notes (Signed)
 " Subjective Patient ID: Jeffrey  Jensen is a 33 y.o. male.  Chief Complaint  Patient presents with   Blurred Vision    Blurred vision, headache on left side, slightly dizzy for 2 months. He was seen in September 2025 for the same condition.    The following information was reviewed by members of the visit team:  Allergies  Meds  Problems     There is no problem list on file for this patient.  No current outpatient medications on file.  No Known Allergies  HPI Patient is a 33 yo male who RTC today with c/o daily headaches and blurry vision.  He headaches started back in 06/2024. He was seen and evaluated at another office and was Rx Motrin  to take. He says he did take the Motrin  a few times. His headaches would resolve. He just never continued to take anymore because is worried that it may have damaged his kidney's.  Since then he has NOT tried any other medication , even Tylenol  to see if his headaches would resolve. Up until 2 months ago he has no pmhx of headaches and no fmhx of Migraines.  He denies any rhinorrhea, nausea, vomiting, dizziness, tinnitus with his headaches. He says at times the headaches are across his forehead. Other times on the right side of his head or the left and go to his neck. Today his headache is on the left side He is requesting an MRI or a CT scan to be done  He is also c/o blurry vision for the past 2 months as well, and at times his eyes get watery. He denies any pain, or redness to his eyes.  He notices his blurry vision is when he is on the computer for fun, playing video games or trying to read.  He is 33 yo and has never had a full eye exam.  He was seen  here the following week after he was first seen for his headache back in 06/2024. He was c/o a 6 weeks time period of having intermittent tingling sensation just randomly in different body parts that would last for 1-2 seconds and resolve on its own.  His  B12, TSH, T4 CBC and CMP were all  normal His A1c was 5.9  He came back on 07/26/24  with ongoing c/o the tingling in various body parts but now lasting for 4-5 seconds.  He did admitted to anxiety his GAD score was 12 He was Rx Gabapentin 100 mg to take at bedtime. And to f/u with his PCP which he has not done.  He has since stopped the Gabapentin.       Review of Systems  Constitutional:  Negative for fever.  Eyes:  Positive for discharge and visual disturbance. Negative for photophobia, pain, redness and itching.       Eye get blurry No diplopia  Neurological:  Positive for headaches. Negative for dizziness, speech difficulty and light-headedness.  Psychiatric/Behavioral:  Negative for sleep disturbance.     ObjectiveBlood pressure 116/70, pulse 72, temperature 98.5 F (36.9 C), temperature source Oral, resp. rate 16, height 1.61 m (5' 3.39), weight 63 kg (139 lb), SpO2 98%. Vision Screening   Right eye Left eye Both eyes  Without correction 20/13 20/15 20/13   With correction       Physical Exam Vitals and nursing note reviewed.  Constitutional:      Appearance: Normal appearance. He is not ill-appearing.  HENT:     Right Ear: Tympanic membrane, ear  canal and external ear normal.     Left Ear: Tympanic membrane, ear canal and external ear normal.     Nose: Nose normal.     Mouth/Throat:     Mouth: Mucous membranes are moist.     Pharynx: Oropharynx is clear.  Eyes:     General: No scleral icterus.       Right eye: No discharge.        Left eye: No discharge.     Extraocular Movements: Extraocular movements intact.     Conjunctiva/sclera: Conjunctivae normal.     Pupils: Pupils are equal, round, and reactive to light.  Cardiovascular:     Rate and Rhythm: Normal rate and regular rhythm.  Pulmonary:     Effort: Pulmonary effort is normal.     Breath sounds: Normal breath sounds.  Musculoskeletal:     Cervical back: Normal range of motion and neck supple. No rigidity or tenderness.   Lymphadenopathy:     Cervical: No cervical adenopathy.  Skin:    General: Skin is warm and dry.  Neurological:     Mental Status: He is alert and oriented to person, place, and time.  Psychiatric:        Attention and Perception: Attention normal.        Mood and Affect: Mood is depressed. Affect is flat.        Speech: Speech normal.        Behavior: Behavior normal.     Assessment/Plan Diagnoses and all orders for this visit:  New daily persistent headache -     CT Head WO Contrast; Future I did explain that I will order the CT scan for him but it will not be done until his Insurance company gives the PA to do so. I asked him to take ES Tylenol  500 mg He is to take 1-2 pills every 6 hrs  or try Excedrin and see if his headaches improve  Blurry vision, bilateral -     Ambulatory referral to Optometry; Future His distance vision is normal I suspect he has Hyperopia and may need reading glasses  He is to make an apt to f/u with his PCP  Electronically signed: Inocente Fairly, PA-C 08/30/2024  3:21 PM   "

## 2024-11-10 ENCOUNTER — Encounter (HOSPITAL_COMMUNITY): Payer: Self-pay | Admitting: Emergency Medicine

## 2024-11-10 ENCOUNTER — Other Ambulatory Visit: Payer: Self-pay

## 2024-11-10 ENCOUNTER — Emergency Department (HOSPITAL_COMMUNITY)

## 2024-11-10 ENCOUNTER — Emergency Department (HOSPITAL_COMMUNITY): Admission: EM | Admit: 2024-11-10 | Discharge: 2024-11-10 | Disposition: A | Discharge status: Home / Self Care

## 2024-11-10 DIAGNOSIS — R55 Syncope and collapse: Secondary | ICD-10-CM | POA: Diagnosis present

## 2024-11-10 DIAGNOSIS — R059 Cough, unspecified: Secondary | ICD-10-CM | POA: Insufficient documentation

## 2024-11-10 LAB — URINALYSIS, ROUTINE W REFLEX MICROSCOPIC
Bilirubin Urine: NEGATIVE
Glucose, UA: NEGATIVE mg/dL
Hgb urine dipstick: NEGATIVE
Ketones, ur: NEGATIVE mg/dL
Leukocytes,Ua: NEGATIVE
Nitrite: NEGATIVE
Protein, ur: NEGATIVE mg/dL
Specific Gravity, Urine: 1.028 (ref 1.005–1.030)
pH: 6 (ref 5.0–8.0)

## 2024-11-10 LAB — COMPREHENSIVE METABOLIC PANEL WITH GFR
ALT: 39 U/L (ref 0–44)
AST: 29 U/L (ref 15–41)
Albumin: 4.1 g/dL (ref 3.5–5.0)
Alkaline Phosphatase: 64 U/L (ref 38–126)
Anion gap: 9 (ref 5–15)
BUN: 18 mg/dL (ref 6–20)
CO2: 26 mmol/L (ref 22–32)
Calcium: 9.2 mg/dL (ref 8.9–10.3)
Chloride: 103 mmol/L (ref 98–111)
Creatinine, Ser: 0.64 mg/dL (ref 0.61–1.24)
GFR, Estimated: >60 mL/min
Glucose, Bld: 103 mg/dL — ABNORMAL HIGH (ref 70–99)
Potassium: 3.6 mmol/L (ref 3.5–5.1)
Sodium: 137 mmol/L (ref 135–145)
Total Bilirubin: 0.4 mg/dL (ref 0.0–1.2)
Total Protein: 7.4 g/dL (ref 6.5–8.1)

## 2024-11-10 LAB — RESP PANEL BY RT-PCR (RSV, FLU A&B, COVID)  RVPGX2
Influenza A by PCR: NEGATIVE
Influenza B by PCR: NEGATIVE
Resp Syncytial Virus by PCR: NEGATIVE
SARS Coronavirus 2 by RT PCR: NEGATIVE

## 2024-11-10 LAB — CBC
HCT: 39.7 % (ref 39.0–52.0)
Hemoglobin: 13.4 g/dL (ref 13.0–17.0)
MCH: 26.9 pg (ref 26.0–34.0)
MCHC: 33.8 g/dL (ref 30.0–36.0)
MCV: 79.7 fL — ABNORMAL LOW (ref 80.0–100.0)
Platelets: 347 K/uL (ref 150–400)
RBC: 4.98 MIL/uL (ref 4.22–5.81)
RDW: 14.3 % (ref 11.5–15.5)
WBC: 7.2 K/uL (ref 4.0–10.5)
nRBC: 0 % (ref 0.0–0.2)

## 2024-11-10 LAB — CBG MONITORING, ED: Glucose-Capillary: 107 mg/dL — ABNORMAL HIGH (ref 70–99)

## 2024-11-10 LAB — TROPONIN T, HIGH SENSITIVITY: Troponin T High Sensitivity: <15 ng/L (ref 0–19)

## 2024-11-10 NOTE — ED Provider Notes (Signed)
 " [place] [facility] AT [facility] Provider Note   CSN: [id number] Arrival date & time: 11/10/24  0044     Patient presents with: Near Syncope   Jeffrey  Jensen is a 34 y.o. male.  Resents today for near syncopal episodes both while at work and at home.  Patient also reports cough, fatigue, and sore throat.  Patient denies fever, chills, nausea, vomiting, numbness, weakness, tinnitus, chest pain, shortness of breath, any other complaints at this time.    Near Syncope       Prior to Admission medications  Medication Sig Start Date End Date Taking? Authorizing Provider  clindamycin  (CLEOCIN ) 300 MG capsule Take 1 capsule (300 mg total) by mouth 3 (three) times daily. 02/20/22   Dean Clarity, MD  HYDROcodone -acetaminophen  (NORCO/VICODIN) 5-325 MG tablet Take 1 tablet by mouth every 4 (four) hours as needed. 02/20/22   Dean Clarity, MD  ibuprofen  (ADVIL ) 200 MG tablet Take 400 mg by mouth every 6 (six) hours as needed for fever or headache.    [provider]  lidocaine  (XYLOCAINE ) 2 % solution Use as directed 15 mLs in the mouth or throat every 6 (six) hours as needed (throat pain). 02/20/22   Dean Clarity, MD  methocarbamol  (ROBAXIN ) 500 MG tablet Take 2 tablets (1,000 mg total) by mouth every 6 (six) hours as needed for muscle spasms. Patient not taking: Reported on 08/10/2019 07/04/19   Danton Jon HERO, PA-C  methylPREDNISolone  (MEDROL  DOSEPAK) 4 MG TBPK tablet follow package directions Patient not taking: Reported on 08/10/2019 07/26/19   Dennise Lavada POUR, MD  predniSONE  (DELTASONE ) 50 MG tablet Take 1 tablet (50 mg total) by mouth daily with breakfast. 02/20/22   Dean Clarity, MD    Allergies: Patient has no known allergies.    Review of Systems  Constitutional:  Positive for fatigue.  HENT:  Positive for sore throat.   Respiratory:  Positive for cough.   Cardiovascular:  Positive for near-syncope.    Updated Vital Signs BP 112/67   Pulse  63   Temp 97.9 F (36.6 C)   Resp 12   Ht 5' 3 (1.6 m)   Wt 62.6 kg   SpO2 95%   BMI 24.45 kg/m   Physical Exam Vitals and nursing note reviewed.  Constitutional:      General: He is not in acute distress.    Appearance: He is well-developed. He is not toxic-appearing.  HENT:     Head: Normocephalic and atraumatic.     Right Ear: External ear normal.     Left Ear: External ear normal.     Nose: Congestion present.     Mouth/Throat:     Mouth: Mucous membranes are moist.     Pharynx: No oropharyngeal exudate.  Eyes:     Extraocular Movements: Extraocular movements intact.     Conjunctiva/sclera: Conjunctivae normal.     Pupils: Pupils are equal, round, and reactive to light.  Cardiovascular:     Rate and Rhythm: Normal rate and regular rhythm.     Pulses: Normal pulses.     Heart sounds: Normal heart sounds. No murmur heard. Pulmonary:     Effort: Pulmonary effort is normal. No respiratory distress.     Breath sounds: Normal breath sounds.  Abdominal:     Palpations: Abdomen is soft.     Tenderness: There is no abdominal tenderness.  Musculoskeletal:        General: No swelling.     Cervical back: Neck supple.  Skin:    General: Skin is warm and dry.     Capillary Refill: Capillary refill takes less than 2 seconds.  Neurological:     General: No focal deficit present.     Mental Status: He is alert and oriented to person, place, and time.  Psychiatric:        Mood and Affect: Mood normal.     (all labs ordered are listed, but only abnormal results are displayed) Labs Reviewed  COMPREHENSIVE METABOLIC PANEL WITH GFR - Abnormal; Notable for the following components:      Result Value   Glucose, Bld 103 (*)    All other components within normal limits  CBC - Abnormal; Notable for the following components:   MCV 79.7 (*)    All other components within normal limits  CBG MONITORING, ED - Abnormal; Notable for the following components:   Glucose-Capillary 107  (*)    All other components within normal limits  RESP PANEL BY RT-PCR (RSV, FLU A&B, COVID)  [id number]  URINALYSIS, ROUTINE W REFLEX MICROSCOPIC  TROPONIN T, HIGH SENSITIVITY    EKG: EKG Interpretation Date/Time:  Saturday November 10 2024 03:58:58 EST Ventricular Rate:  60 PR Interval:  138 QRS Duration:  86 QT Interval:  408 QTC Calculation: 408 R Axis:   39  Text Interpretation: Sinus rhythm Confirmed by Simon Rea [phone]) on 11/10/2024 4:04:32 AM  Radiology: CT Chest Wo Contrast Result Date: 11/10/2024 EXAM: CT CHEST WITHOUT CONTRAST 11/10/2024 03:39:00 AM TECHNIQUE: CT of the chest was performed without the administration of intravenous contrast. Multiplanar reformatted images are provided for review. Automated exposure control, iterative reconstruction, and/or weight based adjustment of the mA/kV was utilized to reduce the radiation dose to as low as reasonably achievable. COMPARISON: PA and lateral chest from today and 01/06/2023, CTA chest 07/25/2019. CLINICAL HISTORY: Abnormal xray - lung nodule, >= 1 cm. FINDINGS: MEDIASTINUM: Heart and pericardium are unremarkable. The central airways are clear. LYMPH NODES: No mediastinal, hilar or axillary lymphadenopathy. LUNGS AND PLEURA: Mild posterior atelectatic changes in the lower lobes. Mild bilateral central bronchial thickening. No focal consolidation or pulmonary edema. No pleural effusion or pneumothorax. No nodules. The finding on today's chest radiograph may have been due to a summation artifact. SOFT TISSUES/BONES: Subareolar discoid gynecomastia in both breasts. No acute abnormality of the bones. UPPER ABDOMEN: Limited images of the upper abdomen demonstrates no acute abnormality. IMPRESSION: 1. No pulmonary nodule, mass, or consolidation. 2. The right upper lobe radiographic finding likely represented summation artifact. 3. Mild central bronchial thickening. Electronically signed by: Francis Quam MD 11/10/2024 04:10 AM EST RP  Workstation: [id number]   DG Chest 2 View Result Date: 11/10/2024 EXAM: 2 VIEW(S) XRAY OF THE CHEST 11/10/2024 02:47:00 AM COMPARISON: PA and lateral chest 01/06/2023. CLINICAL HISTORY: cough with a near syncopal episode. FINDINGS: LUNGS AND PLEURA: There is a 1.4 cm rounded opacity in the right upper lobe on the PA view only which could represent a summation artifact, a lung nodule, small round pneumonia or early cavitary process. Chest CT is recommended for further evaluation. Remaining lungs are clear. There is mild chronic elevation of the right hemidiaphragm. No pleural effusion. No pneumothorax. HEART AND MEDIASTINUM: No acute abnormality of the cardiac and mediastinal silhouettes. BONES AND SOFT TISSUES: No acute osseous abnormality. IMPRESSION: 1. 1.4 cm rounded opacity in the right upper lobe on the PA view only, which could represent a summation artifact, lung nodule, small round pneumonia, or early cavitary process; chest CT is recommended  for further evaluation. Electronically signed by: Francis Quam MD 11/10/2024 03:12 AM EST RP Workstation: [id number]     Procedures   Medications Ordered in the ED - No data to display                                  Medical Decision Making Amount and/or Complexity of Data Reviewed Labs: ordered. Radiology: ordered.   This patient presents to the ED for concern of near syncope and cough differential diagnosis includes COVID, flu, RSV, pneumonia, anemia, electro abnormality, mild dehydration, orthostatic hypotension    Additional history obtained   Additional history obtained from Electronic Medical Record External records from outside source obtained and reviewed including    Lab Tests:  I Ordered, and personally interpreted labs.  The pertinent results include:  CMP unremarkable, CBC unremarkable, UA unremarkable, negative respiratory panel, troponin less than 15   Imaging Studies ordered:  I ordered imaging studies including  chest x-ray I independently visualized and interpreted imaging which showed 1.4 cm rounded opacity in the right upper lobe on the PA view only, could represent a summation artifact, lung nodule, small round pneumonia, or early cavitary process. I agree with the radiologist interpretation EKG: Normal sinus rhythm CT chest Noncon: No pulmonary nodule, mass, or consolidation.  Right upper lobe radiographic finding likely represented summation artifact.  Mild central bronchial thickening.  Problem List / ED Course:  Patient orthostatic negative Considered for admission or further workup however patient's vital signs, physical exam, labs, and imaging are reassuring.  Patient advised to maintain adequate oral hydration and follow-up with PCP if his symptoms persist.  Patient given return precautions.  I feel patient is safe for discharge at this time.     Final diagnoses:  Near syncope    ED Discharge Orders     None          Allanah Mcfarland N, PA-C 11/10/24 0459    Simon Lavonia SAILOR, MD 11/10/24 [phone]  "

## 2024-11-10 NOTE — ED Triage Notes (Signed)
 Patient coming to ED for evaluation of near syncopal episode.  Reports on Wednesday he had a near syncopal episode while at work.  Tonight, about 30 minutes prior to arrival, patient began to feel bad.  States I laid down for about 15 minutes.  When I sat up I felt the blood rush to my head.  I felt like I was going to go out again.  No reports of pain currently.

## 2024-11-10 NOTE — Discharge Instructions (Addendum)
 Today you were seen for near syncope.  Your workup on the emergency department was reassuring.  Please follow-up with your PCP if your symptoms persist for further evaluation and workup.  Thank you for letting us  treat you today. After reviewing your labs and imaging, I feel you are safe to go home. Please follow up with your PCP in the next several days and provide them with your records from this visit. Return to the Emergency Room if pain becomes severe or symptoms worsen.
